# Patient Record
Sex: Female | Born: 1955 | Race: White | Hispanic: No | Marital: Married | Smoking: Never smoker
Health system: Southern US, Community
[De-identification: ages and names within clinical notes are randomized; demographics above are authoritative.]

## PROBLEM LIST (undated history)

## (undated) DIAGNOSIS — M1990 Unspecified osteoarthritis, unspecified site: Secondary | ICD-10-CM

## (undated) DIAGNOSIS — K219 Gastro-esophageal reflux disease without esophagitis: Secondary | ICD-10-CM

## (undated) HISTORY — PX: OVARIAN CYST SURGERY: SHX726

## (undated) HISTORY — PX: BREAST EXCISIONAL BIOPSY: SUR124

---

## 2003-07-18 ENCOUNTER — Inpatient Hospital Stay (HOSPITAL_COMMUNITY): Admission: EM | Admit: 2003-07-18 | Discharge: 2003-07-19 | Payer: Self-pay | Admitting: Emergency Medicine

## 2006-12-05 ENCOUNTER — Ambulatory Visit: Payer: Self-pay | Admitting: Internal Medicine

## 2013-06-04 ENCOUNTER — Observation Stay: Payer: Self-pay | Admitting: Family Medicine

## 2013-06-04 LAB — BASIC METABOLIC PANEL
Anion Gap: 3 — ABNORMAL LOW (ref 7–16)
BUN: 16 mg/dL (ref 7–18)
Creatinine: 0.94 mg/dL (ref 0.60–1.30)
Osmolality: 276 (ref 275–301)
Potassium: 4.1 mmol/L (ref 3.5–5.1)

## 2013-06-04 LAB — CBC
HCT: 37 % (ref 35.0–47.0)
HGB: 12.4 g/dL (ref 12.0–16.0)
MCH: 29.5 pg (ref 26.0–34.0)
MCV: 88 fL (ref 80–100)
Platelet: 229 10*3/uL (ref 150–440)
RBC: 4.19 10*6/uL (ref 3.80–5.20)
WBC: 7.1 10*3/uL (ref 3.6–11.0)

## 2013-06-04 LAB — CK TOTAL AND CKMB (NOT AT ARMC): CK-MB: 0.9 ng/mL (ref 0.5–3.6)

## 2013-06-04 LAB — TROPONIN I: Troponin-I: <0.02 ng/mL

## 2013-06-05 LAB — TROPONIN I
Troponin-I: <0.02 ng/mL
Troponin-I: <0.02 ng/mL

## 2013-06-05 LAB — CK TOTAL AND CKMB (NOT AT ARMC)
CK, Total: 78 U/L (ref 21–215)
CK-MB: 1.1 ng/mL (ref 0.5–3.6)

## 2013-07-16 ENCOUNTER — Emergency Department: Payer: Self-pay | Admitting: Emergency Medicine

## 2013-10-01 ENCOUNTER — Other Ambulatory Visit: Payer: Self-pay | Admitting: Orthopedic Surgery

## 2013-10-01 DIAGNOSIS — M25562 Pain in left knee: Secondary | ICD-10-CM

## 2013-10-06 ENCOUNTER — Ambulatory Visit
Admission: RE | Admit: 2013-10-06 | Discharge: 2013-10-06 | Disposition: A | Discharge status: Home / Self Care | Payer: No Typology Code available for payment source | Source: Ambulatory Visit | Attending: Orthopedic Surgery | Admitting: Orthopedic Surgery

## 2013-10-06 DIAGNOSIS — M25562 Pain in left knee: Secondary | ICD-10-CM

## 2013-12-26 ENCOUNTER — Emergency Department: Payer: Self-pay | Admitting: Emergency Medicine

## 2015-04-09 NOTE — Discharge Summary (Signed)
PATIENT NAME:  Danielle [name], Danielle [name] MR#:  [id number] DATE OF BIRTH:  [birth year]  DATE OF ADMISSION:  06/04/2013 DATE OF DISCHARGE:  06/05/2013  DISPOSITION: Home.   DISCHARGE FOLLOWUP:  Primary care physician, Dr. Loma Senderharles Phillips.  Follow up in 2 to 4 weeks.  ADMISSION DIAGNOSIS:  Atypical chest pain, likely musculoskeletal secondary to mild cough due to seasonal allergies.   DISCHARGE MEDICATIONS:  1.  Ibuprofen 200 mg take up to 4 tablets 2 x [name] day p.r.n.  2.  Pristiq 50 mg once [name] day.   IMPORTANT RESULTS: The patient's electrolytes were within normal limits with [name] sodium of 138 and potassium of 4.1. Troponins were negative x 3. White count 7.1, hemoglobin 12.  Lexi scan did not show any significant pathology.  Mostly [name] stress test was negative with no significant wall motion abnormalities and estimated ejection fraction of 62%.   Chest x-ray did not show any significant pathology either.   HOSPITAL COURSE:  The patient is [name] 59 year old female with history of depression, osteoarthritis, hyperlipidemia and acid reflux who comes with chest tightness and headache. Apparently, the patient had [name] headache earlier at home and slept until noon prior to coming to the hospital, woke up and went to work, and at work she got really hot since the air conditioner of her car was not working. She has been having [name] little bit of allergies, has been coughing [name] little bit more. No significant sputum. No significant fevers. No signs of pneumonia, just cough. She had an episode of vomiting and felt dizzy due to the extreme heat in the car and went to the ER. In the ER, she was feeling tightness of her chest. Her blood pressure was 155/67, and she was evaluated for chest pain. Her cardiac enzymes were negative and, as mentioned above, her Myoview exam was normal. Her pain was 100% reproducible and the patient is able to be discharged in good condition with follow-up with her primary care physician.   TIME SPENT:  About 35 minutes with this discharge on the day of discharge.  ____________________________ Felipa Furnaceoberto Sanchez Gutierrez, MD rsg:sb D: 06/06/2013 07:26:20 ET T: 06/06/2013 07:39:14 ET JOB#: [id number]  cc: Felipa Furnaceoberto Sanchez Gutierrez, MD, <Dictator> Marcine Matarharles W. Phillips Jr., MD Regan RakersOBERTO Juanda ChanceSANCHEZ GUTIERRE MD ELECTRONICALLY SIGNED 06/16/2013 6:41

## 2015-04-09 NOTE — H&P (Signed)
PATIENT NAME:  Danielle [name], Danielle [name] MR#:  [id number] DATE OF BIRTH:  [birth year]  DATE OF ADMISSION:  06/04/2013  PRIMARY CARE PHYSICIAN: Danielle Matarharles W. Phillips Jr., MD   CHIEF COMPLAINT: Chest tightness and headache.   HISTORY OF PRESENT ILLNESS:  Danielle [name] is [name] 59 year old Caucasian female with past medical history of osteoarthritis, acid reflux and depression, comes to the Emergency Room with complaints of chest tightness today. The patient says she had [name] headache earlier at home, slept until noon, woke up, went to work. She said she felt very hot since her air conditioning in the car was not working. After she went to work, her headache got aggravated. She had one-time vomiting and felt dizzy and had another episode of chest tightness. She was brought to the Emergency Room. Currently, she is hemodynamically stable other than her blood pressure 155/67. The patient's first set of cardiac enzymes are negative, and her EKG shows no acute changes. The patient is being admitted for further evaluation and management.   PAST MEDICAL HISTORY: 1.  Depression.  2.  Osteoarthritis.  3.  Acid reflux.   ALLERGIES: TO COMPAZINE.   MEDICATIONS: 1.  Ibuprofen 200 mg 4 tablets twice [name] day.  2.  Pristiq 50 mg extended release p.o. daily.   SOCIAL HISTORY: Nonsmoker. Nonalcoholic.  Works at [employer].   FAMILY HISTORY: Mother and father had heart disease.  They both died at age 60.   REVIEW OF SYSTEMS:   CONSTITUTIONAL: No fever, fatigue. Positive for weakness.  EYES: No blurred or double vision. No glaucoma or cataracts.  ENT: No tinnitus, ear pain, hearing loss.  RESPIRATORY: No cough, wheeze, hemoptysis or COPD.  CARDIOVASCULAR: Positive for chest tightness. No arrhythmia or dyspnea on exertion, palpitations.  GASTROINTESTINAL: No nausea. Positive for vomiting x 1. No diarrhea or abdominal pain.  GENITOURINARY: No dysuria, hematuria or kidney stones.  ENDOCRINE: No polyuria, nocturia or thyroid  problems.  HEMATOLOGY: No anemia or easy bruising.  SKIN: No acne or rash.  MUSCULOSKELETAL: Positive for arthritis left knee, chronic.  NEUROLOGIC: No CVA, TIA, vertigo, ataxia or dementia.  PSYCHIATRIC: No anxiety. Positive for depression.  All other systems reviewed and negative.   PHYSICAL EXAMINATION: GENERAL: The patient is awake, alert, oriented x 3, not in acute distress.  VITAL SIGNS: Afebrile, pulse is 66, blood pressure is 154/65, sats are 98% on room air.  HEENT: Atraumatic, normocephalic. PERRLA. EOM intact. Oral mucosa is moist.  NECK: Supple. No JVD. No carotid bruit.  RESPIRATORY: Clear to auscultation bilaterally. No rales, rhonchi, respiratory distress or labored breathing.  CARDIOVASCULAR: Both the heart sounds are normal. Rate, rhythm regular. PMI not lateralized. Chest is nontender.  EXTREMITIES: Good pedal pulses, good femoral pulses. No lower extremity edema.  ABDOMEN: Soft, benign, nontender. No organomegaly. Positive bowel sounds.  NEUROLOGIC: Grossly intact cranial nerves II through XII. No motor or sensory deficits.  PSYCHIATRIC: The patient is awake, alert, oriented x 3.  SKIN: Warm and dry.   LABORATORY AND RADIOLOGICAL DATA:  EKG: Sinus bradycardia, heart rate 59, no ST-T changes. CBC within normal limits. Basic metabolic panel within normal limits. Cardiac enzymes, first set negative.   ASSESSMENT: Danielle [name] is [name] 59 year old with history of osteoarthritis and depression, comes in with:   1.  Chest pain, rule out myocardial infarction/unstable angina:  We will admit patient for overnight observation on telemetry floor, cycle cardiac enzymes x 3. Regular diet and p.r.n. nitroglycerin and baby aspirin daily. We will start the patient  on some metoprolol since her blood pressure is [name] little bit on the higher side.  If it remains on the higher side, continue at discharge as well. Myoview stress test in the morning unless,if  patient rules in, consider  cardiology consultation.  2.  Gastroesophageal reflux disease:  Likely secondary to mild gastritis. The patient takes regularly ibuprofen. She is recommended to down on it and to consider more of Tylenol use. We will put the patient on Zantac.  3.  Depression:  Continue Pristiq.  4.  Chronic left knee arthritis:  I  recommended the patient to follow up, have outpatient follow up with Orthopedics.  5.  Deep vein thrombosis prophylaxis:  Heparin subcutaneous t.i.d.  6.  Further workup  according to the patient's clinical course.   Hospital admission plan was discussed with the patient.   TIME SPENT: 50 minutes.   ____________________________ Wylie Hail Allena Katz, MD sap:cb D: 06/04/2013 19:34:56 ET T: 06/04/2013 20:05:59 ET JOB#: [id number]  cc: Danielle Hendry [name]. Allena Katz, MD, <Dictator> Danielle Matar., MD  Danielle Ora MD ELECTRONICALLY SIGNED 06/11/2013 14:28

## 2015-10-19 ENCOUNTER — Other Ambulatory Visit: Payer: Self-pay | Admitting: Adult Health

## 2015-10-19 DIAGNOSIS — Z1231 Encounter for screening mammogram for malignant neoplasm of breast: Secondary | ICD-10-CM

## 2015-10-28 ENCOUNTER — Ambulatory Visit: Payer: No Typology Code available for payment source

## 2015-11-04 ENCOUNTER — Ambulatory Visit
Admission: RE | Admit: 2015-11-04 | Discharge: 2015-11-04 | Disposition: A | Discharge status: Home / Self Care | Payer: Commercial Managed Care - PPO | Source: Ambulatory Visit | Attending: Family Medicine | Admitting: Family Medicine

## 2015-11-04 DIAGNOSIS — Z1231 Encounter for screening mammogram for malignant neoplasm of breast: Secondary | ICD-10-CM | POA: Diagnosis not present

## 2016-04-02 ENCOUNTER — Emergency Department: Payer: Commercial Managed Care - PPO

## 2016-04-02 ENCOUNTER — Encounter: Payer: Self-pay | Admitting: Internal Medicine

## 2016-04-02 ENCOUNTER — Inpatient Hospital Stay
Admission: EM | Admit: 2016-04-02 | Discharge: 2016-04-10 | DRG: 872 | Disposition: A | Discharge status: Skilled Nursing Facility | Payer: Commercial Managed Care - PPO | Attending: Internal Medicine | Admitting: Internal Medicine

## 2016-04-02 DIAGNOSIS — Z888 Allergy status to other drugs, medicaments and biological substances status: Secondary | ICD-10-CM

## 2016-04-02 DIAGNOSIS — M25512 Pain in left shoulder: Secondary | ICD-10-CM | POA: Diagnosis present

## 2016-04-02 DIAGNOSIS — L03113 Cellulitis of right upper limb: Secondary | ICD-10-CM | POA: Diagnosis present

## 2016-04-02 DIAGNOSIS — R809 Proteinuria, unspecified: Secondary | ICD-10-CM | POA: Diagnosis present

## 2016-04-02 DIAGNOSIS — M1990 Unspecified osteoarthritis, unspecified site: Secondary | ICD-10-CM | POA: Diagnosis present

## 2016-04-02 DIAGNOSIS — A4101 Sepsis due to Methicillin susceptible Staphylococcus aureus: Secondary | ICD-10-CM | POA: Diagnosis not present

## 2016-04-02 DIAGNOSIS — K219 Gastro-esophageal reflux disease without esophagitis: Secondary | ICD-10-CM | POA: Diagnosis present

## 2016-04-02 DIAGNOSIS — R319 Hematuria, unspecified: Secondary | ICD-10-CM | POA: Diagnosis present

## 2016-04-02 DIAGNOSIS — E785 Hyperlipidemia, unspecified: Secondary | ICD-10-CM | POA: Diagnosis present

## 2016-04-02 DIAGNOSIS — M545 Low back pain, unspecified: Secondary | ICD-10-CM

## 2016-04-02 DIAGNOSIS — R4182 Altered mental status, unspecified: Secondary | ICD-10-CM | POA: Diagnosis not present

## 2016-04-02 DIAGNOSIS — M009 Pyogenic arthritis, unspecified: Secondary | ICD-10-CM | POA: Diagnosis present

## 2016-04-02 DIAGNOSIS — R7881 Bacteremia: Secondary | ICD-10-CM

## 2016-04-02 DIAGNOSIS — M549 Dorsalgia, unspecified: Secondary | ICD-10-CM | POA: Diagnosis present

## 2016-04-02 DIAGNOSIS — E860 Dehydration: Secondary | ICD-10-CM | POA: Diagnosis present

## 2016-04-02 DIAGNOSIS — G8929 Other chronic pain: Secondary | ICD-10-CM | POA: Diagnosis present

## 2016-04-02 DIAGNOSIS — R062 Wheezing: Secondary | ICD-10-CM | POA: Diagnosis present

## 2016-04-02 DIAGNOSIS — N390 Urinary tract infection, site not specified: Secondary | ICD-10-CM

## 2016-04-02 DIAGNOSIS — N179 Acute kidney failure, unspecified: Secondary | ICD-10-CM | POA: Diagnosis present

## 2016-04-02 DIAGNOSIS — M8448XA Pathological fracture, other site, initial encounter for fracture: Secondary | ICD-10-CM | POA: Diagnosis present

## 2016-04-02 DIAGNOSIS — M4316 Spondylolisthesis, lumbar region: Secondary | ICD-10-CM | POA: Diagnosis present

## 2016-04-02 HISTORY — DX: Unspecified osteoarthritis, unspecified site: M19.90

## 2016-04-02 HISTORY — DX: Gastro-esophageal reflux disease without esophagitis: K21.9

## 2016-04-02 LAB — URINE DRUG SCREEN, QUALITATIVE (ARMC ONLY)
AMPHETAMINES, UR SCREEN: NOT DETECTED
BENZODIAZEPINE, UR SCRN: NOT DETECTED
Barbiturates, Ur Screen: NOT DETECTED
CANNABINOID 50 NG, UR ~~LOC~~: NOT DETECTED
Cocaine Metabolite,Ur ~~LOC~~: NOT DETECTED
MDMA (Ecstasy)Ur Screen: NOT DETECTED
Methadone Scn, Ur: NOT DETECTED
Opiate, Ur Screen: POSITIVE — AB
Phencyclidine (PCP) Ur S: NOT DETECTED
TRICYCLIC, UR SCREEN: NOT DETECTED

## 2016-04-02 LAB — URINALYSIS COMPLETE WITH MICROSCOPIC (ARMC ONLY)
Bilirubin Urine: NEGATIVE
Glucose, UA: 50 mg/dL — AB
Ketones, ur: NEGATIVE mg/dL
Nitrite: NEGATIVE
PH: 5 (ref 5.0–8.0)
PROTEIN: 100 mg/dL — AB
SPECIFIC GRAVITY, URINE: 1.016 (ref 1.005–1.030)

## 2016-04-02 LAB — CBC WITH DIFFERENTIAL/PLATELET
Basophils Absolute: 0 10*3/uL (ref 0–0.1)
Basophils Relative: 0 %
EOS PCT: 0 %
Eosinophils Absolute: 0 10*3/uL (ref 0–0.7)
HCT: 32 % — ABNORMAL LOW (ref 35.0–47.0)
Hemoglobin: 10.5 g/dL — ABNORMAL LOW (ref 12.0–16.0)
LYMPHS ABS: 0.5 10*3/uL — AB (ref 1.0–3.6)
LYMPHS PCT: 2 %
MCH: 28.8 pg (ref 26.0–34.0)
MCHC: 33 g/dL (ref 32.0–36.0)
MCV: 87.2 fL (ref 80.0–100.0)
MONO ABS: 1.2 10*3/uL — AB (ref 0.2–0.9)
MONOS PCT: 4 %
Neutro Abs: 25.8 10*3/uL — ABNORMAL HIGH (ref 1.4–6.5)
Neutrophils Relative %: 94 %
PLATELETS: 234 10*3/uL (ref 150–440)
RBC: 3.67 MIL/uL — ABNORMAL LOW (ref 3.80–5.20)
RDW: 15.3 % — AB (ref 11.5–14.5)
WBC: 27.6 10*3/uL — ABNORMAL HIGH (ref 3.6–11.0)

## 2016-04-02 LAB — COMPREHENSIVE METABOLIC PANEL
ALBUMIN: 3.2 g/dL — AB (ref 3.5–5.0)
ALT: 40 U/L (ref 14–54)
AST: 27 U/L (ref 15–41)
Alkaline Phosphatase: 164 U/L — ABNORMAL HIGH (ref 38–126)
Anion gap: 14 (ref 5–15)
BILIRUBIN TOTAL: 0.2 mg/dL — AB (ref 0.3–1.2)
BUN: 69 mg/dL — AB (ref 6–20)
CHLORIDE: 102 mmol/L (ref 101–111)
CO2: 18 mmol/L — AB (ref 22–32)
Calcium: 8.2 mg/dL — ABNORMAL LOW (ref 8.9–10.3)
Creatinine, Ser: 3.66 mg/dL — ABNORMAL HIGH (ref 0.44–1.00)
GFR calc Af Amer: 14 mL/min — ABNORMAL LOW (ref >60)
GFR calc non Af Amer: 12 mL/min — ABNORMAL LOW (ref >60)
GLUCOSE: 120 mg/dL — AB (ref 65–99)
Potassium: 3.9 mmol/L (ref 3.5–5.1)
Sodium: 134 mmol/L — ABNORMAL LOW (ref 135–145)
Total Protein: 7.2 g/dL (ref 6.5–8.1)

## 2016-04-02 LAB — LACTIC ACID, PLASMA: Lactic Acid, Venous: 1.2 mmol/L (ref 0.5–2.0)

## 2016-04-02 LAB — BRAIN NATRIURETIC PEPTIDE: B NATRIURETIC PEPTIDE 5: 506 pg/mL — AB (ref 0.0–100.0)

## 2016-04-02 LAB — TROPONIN I: TROPONIN I: 0.06 ng/mL — AB (ref <0.031)

## 2016-04-02 MED ORDER — SODIUM CHLORIDE 0.9 % IV BOLUS (SEPSIS)
1000.0000 mL | Freq: Once | INTRAVENOUS | Status: AC
  Administered 2016-04-02: 1000 mL via INTRAVENOUS

## 2016-04-02 MED ORDER — PIPERACILLIN-TAZOBACTAM 3.375 G IVPB 30 MIN
3.3750 g | Freq: Once | INTRAVENOUS | Status: AC
  Administered 2016-04-02: 3.375 g via INTRAVENOUS
  Filled 2016-04-02: qty 50

## 2016-04-02 MED ORDER — SODIUM CHLORIDE 0.9 % IV BOLUS (SEPSIS): 500.0000 mL | Freq: Once | INTRAVENOUS | Status: DC

## 2016-04-02 NOTE — H&P (Signed)
[facility] [place] [facility]   PATIENT NAME: Danielle GaulRebecca [name]    MR#:  [id number]  DATE OF BIRTH:  1956-10-30  DATE OF ADMISSION:  04/02/2016  PRIMARY CARE PHYSICIAN: No primary care provider on file.   REQUESTING/REFERRING PHYSICIAN:   CHIEF COMPLAINT:   Chief Complaint  Patient presents with  . Shortness of Breath  . Altered Mental Status    HISTORY OF PRESENT ILLNESS: Danielle GaulRebecca [name]  is a 60 y.o. female with a known history of Osteoarthritis, GERD presented to the emergency room because of weakness and somnolence. Patient had been recently seeing the orthopedic doctor and also [facility] for low back pain and she is on pain medication and also completed her dose of oral steroids. Her last day of oral steroids for this today. According to the family members patient has been more lethargic and somnolent after being on pain medication. Patient is awake and alert and responds to all verbal commands in the emergency room today. Has history of dysuria. No history of any chest pain or shortness of breath. Has generalized weakness. Upon evaluation the emergency room she appeared to be dehydrated and also her creatinine was elevated. Patient was started on IV fluids and the received a IV antibiotic in the emergency room for possible urinary tract infection.No complaints of cough. No history or recent travel or sick contacts at home.  PAST MEDICAL HISTORY:   Past Medical History  Diagnosis Date  . GERD (gastroesophageal reflux disease)   . Osteoarthritis     PAST SURGICAL HISTORY: Past Surgical History  Procedure Laterality Date  . Breast excisional biopsy Left     1981 cyst removed  . Ovarian cyst surgery      SOCIAL HISTORY:  Social History  Substance Use Topics  . Smoking status: Never Smoker   . Smokeless tobacco: Not on file  . Alcohol Use: No    FAMILY HISTORY:  Family History  Problem Relation Age of Onset  . Breast cancer Neg Hx      DRUG ALLERGIES:  Allergies  Allergen Reactions  . Compazine [Prochlorperazine Edisylate] Nausea And Vomiting    REVIEW OF SYSTEMS:   CONSTITUTIONAL: No fever, has weakness.  EYES: No blurred or double vision.  EARS, NOSE, AND THROAT: No tinnitus or ear pain.  RESPIRATORY: No cough, shortness of breath, wheezing or hemoptysis.  CARDIOVASCULAR: No chest pain, orthopnea, edema.  GASTROINTESTINAL: No nausea, vomiting, diarrhea or abdominal pain.  GENITOURINARY: Has dysuria, hematuria.  ENDOCRINE: No polyuria, nocturia,  HEMATOLOGY: No anemia, easy bruising or bleeding SKIN: No rash or lesion. MUSCULOSKELETAL: No joint pain or arthritis.  Low back pain NEUROLOGIC: No tingling, numbness, weakness.  PSYCHIATRY: No anxiety or depression.   MEDICATIONS AT HOME:  Prior to Admission medications   Medication Sig Start Date End Date Taking? Authorizing Provider  albuterol (PROVENTIL HFA;VENTOLIN HFA) 108 (90 Base) MCG/ACT inhaler Inhale 2 puffs into the lungs every 6 (six) hours as needed for wheezing or shortness of breath.   Yes Historical Provider, MD  ALPRAZolam Prudy Feeler(XANAX) 0.5 MG tablet Take 0.5 mg by mouth at bedtime as needed for anxiety.   Yes Historical Provider, MD  desvenlafaxine (PRISTIQ) 50 MG 24 hr tablet Take 50 mg by mouth daily.   Yes Historical Provider, MD  meloxicam (MOBIC) 15 MG tablet Take 15 mg by mouth daily.   Yes Historical Provider, MD  pantoprazole (PROTONIX) 40 MG tablet Take 40 mg by mouth daily.   Yes Historical  Provider, MD  tiZANidine (ZANAFLEX) 4 MG tablet Take 4 mg by mouth at bedtime.   Yes Historical Provider, MD  traMADol (ULTRAM) 50 MG tablet Take 50 mg by mouth every 6 (six) hours as needed.   Yes Historical Provider, MD      PHYSICAL EXAMINATION:   VITAL SIGNS: Blood pressure 127/74, pulse 99, temperature 98 F (36.7 C), resp. rate 22, height  (1.549 m), weight 87.544 kg (193 lb), SpO2 94 %.  GENERAL:  60 y.o.-year-old patient lying in the  bed with no acute distress.  EYES: Pupils equal, round, reactive to light and accommodation. No scleral icterus. Extraocular muscles intact.  HEENT: Head atraumatic, normocephalic. Oropharynx dry and nasopharynx clear.  NECK:  Supple, no jugular venous distention. No thyroid enlargement, no tenderness.  LUNGS: Normal breath sounds bilaterally, no wheezing, rales,rhonchi or crepitation. No use of accessory muscles of respiration.  CARDIOVASCULAR: S1, S2 normal. No murmurs, rubs, or gallops.  ABDOMEN: Soft, nontender, nondistended. Bowel sounds present. No organomegaly or mass.  EXTREMITIES: No pedal edema, cyanosis, or clubbing.  NEUROLOGIC: Cranial nerves II through XII are intact. Muscle strength 5/5 in all extremities. Sensation intact. Gait not checked.  PSYCHIATRIC: The patient is alert and oriented x 3.  SKIN: No obvious rash, lesion, or ulcer.   LABORATORY PANEL:   CBC  Recent Labs Lab 04/02/16 1916  WBC 27.6*  HGB 10.5*  HCT 32.0*  PLT 234  MCV 87.2  MCH 28.8  MCHC 33.0  RDW 15.3*  LYMPHSABS 0.5*  MONOABS 1.2*  EOSABS 0.0  BASOSABS 0.0   ------------------------------------------------------------------------------------------------------------------  Chemistries   Recent Labs Lab 04/02/16 1916  NA 134*  K 3.9  CL 102  CO2 18*  GLUCOSE 120*  BUN 69*  CREATININE 3.66*  CALCIUM 8.2*  AST 27  ALT 40  ALKPHOS 164*  BILITOT 0.2*   ------------------------------------------------------------------------------------------------------------------ estimated creatinine clearance is 16.2 mL/min (by C-G formula based on Cr of 3.66). ------------------------------------------------------------------------------------------------------------------ No results for input(s): TSH, T4TOTAL, T3FREE, THYROIDAB in the last 72 hours.  Invalid input(s): FREET3   Coagulation profile No results for input(s): INR, PROTIME in the last 168  hours. ------------------------------------------------------------------------------------------------------------------- No results for input(s): DDIMER in the last 72 hours. -------------------------------------------------------------------------------------------------------------------  Cardiac Enzymes  Recent Labs Lab 04/02/16 1916  TROPONINI 0.06*   ------------------------------------------------------------------------------------------------------------------ Invalid input(s): POCBNP  ---------------------------------------------------------------------------------------------------------------  Urinalysis    Component Value Date/Time   COLORURINE AMBER* 04/02/2016 1916   APPEARANCEUR CLOUDY* 04/02/2016 1916   LABSPEC 1.016 04/02/2016 1916   PHURINE 5.0 04/02/2016 1916   GLUCOSEU 50* 04/02/2016 1916   HGBUR 2+* 04/02/2016 1916   BILIRUBINUR NEGATIVE 04/02/2016 1916   KETONESUR NEGATIVE 04/02/2016 1916   PROTEINUR 100* 04/02/2016 1916   NITRITE NEGATIVE 04/02/2016 1916   LEUKOCYTESUR 1+* 04/02/2016 1916     RADIOLOGY: Dg Chest 2 View  04/02/2016  CLINICAL DATA:  Altered mental status.  Wheezing. EXAM: CHEST  2 VIEW COMPARISON:  06/04/2013 FINDINGS: Slight increase of left hemidiaphragm with linear atelectasis in the left lung base. No focal consolidation or airspace disease. No blunting of costophrenic angles. No pneumothorax. Normal heart size and pulmonary vascularity. Degenerative changes in the spine and shoulders. IMPRESSION: Elevation of left hemidiaphragm with slight linear atelectasis in the left lung base. Electronically Signed   By: Burman Nieves M.D.   On: 04/02/2016 21:20   Dg Hand Complete Right  04/02/2016  CLINICAL DATA:  Patient with metacarpal pain after hitting hand on the porch railing. Initial encounter. EXAM: RIGHT HAND -  COMPLETE 3+ VIEW COMPARISON:  Hand radiograph 07/16/2013 FINDINGS: Normal anatomic alignment. No evidence for acute fracture  or dislocation. Soft tissue swelling about the dorsum of the hand. IMPRESSION: No acute osseous abnormality. Electronically Signed   By: Annia Belt M.D.   On: 04/02/2016 21:22    EKG: Orders placed or performed during the hospital encounter of 04/02/16  . EKG 12-Lead  . EKG 12-Lead    IMPRESSION AND PLAN: 60 year old female patient with history of osteoarthritis, GERD, back pain presented to the emergency room with generalized weakness and transient disorientation. Admitting diagnosis 1. Dehydration 2. Acute kidney injury 3. Urinary tract infection 4. Leukocytosis which could be secondary to steroids 5. Osteoarthritis 6. GERD Treatment plan Admit patient to medical floor observation bed IV fluid hydration Start patient on IV Rocephin antibiotic and follow-up urine culture Follow-up renal functions and electrolytes DVT prophylaxis with subcutaneous heparin Monitor WBC count. Supportive care  All the records are reviewed and case discussed with ED provider. Management plans discussed with the patient, family and they are in agreement.  CODE STATUS:FULL  Code Status History    This patient does not have a recorded code status. Please follow your organizational policy for patients in this situation.       TOTAL TIME TAKING CARE OF THIS PATIENT: 50 minutes.    Ihor Austin M.D on 04/02/2016 at 11:41 PM  Between 7am to 6pm - Pager - [phone]  After 6pm go to [email or web address] - password [email or web address] [facility]  [place] [facility]  Office  [phone]  CC: Primary care physician; No primary care provider on file.

## 2016-04-02 NOTE — ED Provider Notes (Signed)
[facility] Emergency Department Provider Note  ____________________________________________   I have reviewed the triage vital signs and the nursing notes.   HISTORY  Chief Complaint Shortness of Breath and Altered Mental Status    HPI Danielle [name] is a 60 y.o. female who has a history of chronic back pain, was acutely worsen her back pain a partially one month ago. Has had multiple visits to emergency department and orthopedic surgeon and primary care for this. She has been neurologically intact with no incontinence of bowel or bladder. The patient did have an MRI which showed sacral insufficiency fractures. She was started on steroids at one point. She says she took her last steroid dose yesterday. In addition, the patient had a urinary tract infection several weeks ago which she has taken all the antibiotics for. She went to [facility] on the 12th and was found to be febrile at 38.8 with a white count that was elevated but as no source of infection was found she was sent home. Patient states that she was also started at that time on high-dose morphine. Patient has never already that kind of medication well. She states that since taking the morphine she's been somnolent, part of this history is per family. Patient also has had a cough that is productive over the last few days and has been At times difficult to arouse after taking the medication. Her back pain however persists. She denies any focal numbness or weakness. She does also have occasional dysuria.The patient has the family very concerned because of her generalized deterioration     No past medical history on file.  There are no active problems to display for this patient.   Past Surgical History  Procedure Laterality Date  . Breast excisional biopsy Left     1981 cyst removed    Current Outpatient Rx  Name  Route  Sig  Dispense  Refill  . albuterol (PROVENTIL HFA;VENTOLIN HFA) 108 (90 Base) MCG/ACT  inhaler   Inhalation   Inhale 2 puffs into the lungs every 6 (six) hours as needed for wheezing or shortness of breath.         . ALPRAZolam (XANAX) 0.5 MG tablet   Oral   Take 0.5 mg by mouth at bedtime as needed for anxiety.         Marland Kitchen desvenlafaxine (PRISTIQ) 50 MG 24 hr tablet   Oral   Take 50 mg by mouth daily.         . meloxicam (MOBIC) 15 MG tablet   Oral   Take 15 mg by mouth daily.         . pantoprazole (PROTONIX) 40 MG tablet   Oral   Take 40 mg by mouth daily.         Marland Kitchen tiZANidine (ZANAFLEX) 4 MG tablet   Oral   Take 4 mg by mouth at bedtime.         . traMADol (ULTRAM) 50 MG tablet   Oral   Take 50 mg by mouth every 6 (six) hours as needed.           Allergies Compazine  Family History  Problem Relation Age of Onset  . Breast cancer Neg Hx     Social History Social History  Substance Use Topics  . Smoking status: Not on file  . Smokeless tobacco: Not on file  . Alcohol Use: Not on file    Review of Systems Constitutional:See history of present illness Eyes: No  visual changes. ENT: No sore throat. No stiff neck no neck pain Cardiovascular: Denies chest pain. Respiratory: Positive shortness of breath. Gastrointestinal:   no vomiting.  No diarrhea.  No constipation. Genitourinary: Positive for dysuria. Musculoskeletal: Negative lower extremity swelling Skin: Negative for rash. Neurological: Negative for headaches, focal weakness or numbness. 10-point ROS otherwise negative.  ____________________________________________   PHYSICAL EXAM:  VITAL SIGNS: ED Triage Vitals  Enc Vitals Group     BP 04/02/16 1914 117/51 mmHg     Pulse Rate 04/02/16 1914 93     Resp 04/02/16 1914 18     Temp 04/02/16 1914 98 F (36.7 C)     Temp src --      SpO2 04/02/16 1914 93 %     Weight 04/02/16 1914 193 lb (87.544 kg)     Height 04/02/16 1914  (1.549 m)     Head Cir --      Peak Flow --      Pain Score 04/02/16 1914 10     Pain  Loc --      Pain Edu? --      Excl. in GC? --     Constitutional: Somnolent originally and then thereafter awake and oriented. In no acute medical distress Eyes: Conjunctivae are normal. PERRL. EOMI. Head: Atraumatic. Nose: No congestion/rhinnorhea. Mouth/Throat: Mucous membranes are moist.  Oropharynx non-erythematous. Neck: No stridor.   Nontender with no meningismus Cardiovascular: Normal rate, regular rhythm. Grossly normal heart sounds.  Good peripheral circulation. Respiratory: Normal respiratory effort.  No retractions. Mild wheeze noted initially then clear after that. Abdominal: Soft and nontender. No distention. No guarding no rebound Back:  Tender to palpation in low back with no step off there is no midline tenderness there are no lesions noted. there is no CVA tenderness there is no obvious lesions noted. Musculoskeletal: No lower extremity tenderness. No joint effusions, no DVT signs strong distal pulses no edema Neurologic:  Normal speech and language. No gross focal neurologic deficits are appreciated. No saddle anesthesia Skin:  Skin is warm, dry and intact. No rash noted. Psychiatric: Mood and affect are normal. Speech and behavior are normal.  ____________________________________________   LABS (all labs ordered are listed, but only abnormal results are displayed)  Labs Reviewed  COMPREHENSIVE METABOLIC PANEL - Abnormal; Notable for the following:    Sodium 134 (*)    CO2 18 (*)    Glucose, Bld 120 (*)    BUN 69 (*)    Creatinine, Ser 3.66 (*)    Calcium 8.2 (*)    Albumin 3.2 (*)    Alkaline Phosphatase 164 (*)    Total Bilirubin 0.2 (*)    GFR calc non Af Amer 12 (*)    GFR calc Af Amer 14 (*)    All other components within normal limits  CBC WITH DIFFERENTIAL/PLATELET - Abnormal; Notable for the following:    WBC 27.6 (*)    RBC 3.67 (*)    Hemoglobin 10.5 (*)    HCT 32.0 (*)    RDW 15.3 (*)    Neutro Abs 25.8 (*)    Lymphs Abs 0.5 (*)    Monocytes  Absolute 1.2 (*)    All other components within normal limits  URINALYSIS COMPLETEWITH MICROSCOPIC ([facility] ONLY) - Abnormal; Notable for the following:    Color, Urine AMBER (*)    APPearance CLOUDY (*)    Glucose, UA 50 (*)    Hgb urine dipstick 2+ (*)    Protein,  ur 100 (*)    Leukocytes, UA 1+ (*)    Bacteria, UA RARE (*)    Squamous Epithelial / LPF 6-30 (*)    All other components within normal limits  URINE DRUG SCREEN, QUALITATIVE ([facility] ONLY) - Abnormal; Notable for the following:    Opiate, Ur Screen POSITIVE (*)    All other components within normal limits  TROPONIN I - Abnormal; Notable for the following:    Troponin I 0.06 (*)    All other components within normal limits  BRAIN NATRIURETIC PEPTIDE - Abnormal; Notable for the following:    B Natriuretic Peptide 506.0 (*)    All other components within normal limits  CULTURE, BLOOD (ROUTINE X 2)  CULTURE, BLOOD (ROUTINE X 2)  URINE CULTURE  LACTIC ACID, PLASMA  LACTIC ACID, PLASMA   ____________________________________________  EKG  I personally interpreted any EKGs ordered by me or triage Sinus rhythm rate 92 no acute ST elevation or depression normal axis ____________________________________________  RADIOLOGY  I reviewed any imaging ordered by me or triage that were performed during my shift and, if possible, patient and/or family made aware of any abnormal findings. ____________________________________________   PROCEDURES  Procedure(s) performed: None  Critical Care performed: CRITICAL CARE Performed by: Jeanmarie PlantJAMES A MCSHANE   Total critical care time: 38 minutes  Critical care time was exclusive of separately billable procedures and treating other patients.  Critical care was necessary to treat or prevent imminent or life-threatening deterioration.  Critical care was time spent personally by me on the following activities: development of treatment plan with patient and/or surrogate as well as nursing,  discussions with consultants, evaluation of patient's response to treatment, examination of patient, obtaining history from patient or surrogate, ordering and performing treatments and interventions, ordering and review of laboratory studies, ordering and review of radiographic studies, pulse oximetry and re-evaluation of patient's condition.   ____________________________________________   INITIAL IMPRESSION / ASSESSMENT AND PLAN / ED COURSE  Pertinent labs & imaging results that were available during my care of the patient were reviewed by me and considered in my medical decision making (see chart for details).  Patient with multiple different issues, she has elevated white count but just finished steroids and has a urinary tract infection she is afebrile not septic in appearance. She has cough which is productive and I'm concerned about the possibility of aspiration although chest x-ray at this time is negative. She has acute renal injury as well. Significant dehydration. We are giving her IV hydration, broad-spectrum antibiotics, and we will admit to the hospital for further evaluation. We did discuss also with the intensivist at the request of the hospitalist, they do not feel that she requires ICU at this time and I agree. ____________________________________________   FINAL CLINICAL IMPRESSION(S) / ED DIAGNOSES  Final diagnoses:  None      This chart was dictated using voice recognition software.  Despite best efforts to proofread,  errors can occur which can change meaning.     Jeanmarie PlantJames A McShane, MD 04/02/16 2221

## 2016-04-02 NOTE — ED Notes (Signed)
Family at bedside.Pt in no acute distress at this time

## 2016-04-02 NOTE — ED Notes (Signed)
Per ems pts family repolrts altered mental status. Pt is a&O x 3. Ems administered 1 duoneb for wheezing. Pt in no distress at this time. Pt reports uti dx but n ot taking antibiotics as prescribed. Pt also reports back pain.

## 2016-04-03 ENCOUNTER — Encounter: Payer: Self-pay | Admitting: *Deleted

## 2016-04-03 ENCOUNTER — Inpatient Hospital Stay: Payer: Commercial Managed Care - PPO

## 2016-04-03 DIAGNOSIS — Z888 Allergy status to other drugs, medicaments and biological substances status: Secondary | ICD-10-CM | POA: Diagnosis not present

## 2016-04-03 DIAGNOSIS — R319 Hematuria, unspecified: Secondary | ICD-10-CM | POA: Diagnosis present

## 2016-04-03 DIAGNOSIS — M4316 Spondylolisthesis, lumbar region: Secondary | ICD-10-CM | POA: Diagnosis present

## 2016-04-03 DIAGNOSIS — R062 Wheezing: Secondary | ICD-10-CM | POA: Diagnosis present

## 2016-04-03 DIAGNOSIS — R509 Fever, unspecified: Secondary | ICD-10-CM | POA: Diagnosis not present

## 2016-04-03 DIAGNOSIS — R4182 Altered mental status, unspecified: Secondary | ICD-10-CM | POA: Diagnosis present

## 2016-04-03 DIAGNOSIS — N179 Acute kidney failure, unspecified: Secondary | ICD-10-CM | POA: Diagnosis present

## 2016-04-03 DIAGNOSIS — L03113 Cellulitis of right upper limb: Secondary | ICD-10-CM | POA: Diagnosis present

## 2016-04-03 DIAGNOSIS — M25512 Pain in left shoulder: Secondary | ICD-10-CM | POA: Diagnosis present

## 2016-04-03 DIAGNOSIS — M009 Pyogenic arthritis, unspecified: Secondary | ICD-10-CM | POA: Diagnosis present

## 2016-04-03 DIAGNOSIS — A4101 Sepsis due to Methicillin susceptible Staphylococcus aureus: Secondary | ICD-10-CM | POA: Diagnosis present

## 2016-04-03 DIAGNOSIS — K219 Gastro-esophageal reflux disease without esophagitis: Secondary | ICD-10-CM | POA: Diagnosis present

## 2016-04-03 DIAGNOSIS — E860 Dehydration: Secondary | ICD-10-CM | POA: Diagnosis present

## 2016-04-03 DIAGNOSIS — E785 Hyperlipidemia, unspecified: Secondary | ICD-10-CM | POA: Diagnosis present

## 2016-04-03 DIAGNOSIS — N390 Urinary tract infection, site not specified: Secondary | ICD-10-CM | POA: Diagnosis present

## 2016-04-03 DIAGNOSIS — R809 Proteinuria, unspecified: Secondary | ICD-10-CM | POA: Diagnosis present

## 2016-04-03 DIAGNOSIS — G8929 Other chronic pain: Secondary | ICD-10-CM | POA: Diagnosis present

## 2016-04-03 DIAGNOSIS — M1990 Unspecified osteoarthritis, unspecified site: Secondary | ICD-10-CM | POA: Diagnosis present

## 2016-04-03 DIAGNOSIS — M549 Dorsalgia, unspecified: Secondary | ICD-10-CM | POA: Diagnosis present

## 2016-04-03 DIAGNOSIS — M8448XA Pathological fracture, other site, initial encounter for fracture: Secondary | ICD-10-CM | POA: Diagnosis present

## 2016-04-03 LAB — CBC
HCT: 27.6 % — ABNORMAL LOW (ref 35.0–47.0)
Hemoglobin: 9.2 g/dL — ABNORMAL LOW (ref 12.0–16.0)
MCH: 29.4 pg (ref 26.0–34.0)
MCHC: 33.2 g/dL (ref 32.0–36.0)
MCV: 88.3 fL (ref 80.0–100.0)
PLATELETS: 214 10*3/uL (ref 150–440)
RBC: 3.12 MIL/uL — ABNORMAL LOW (ref 3.80–5.20)
RDW: 15.3 % — AB (ref 11.5–14.5)
WBC: 25 10*3/uL — ABNORMAL HIGH (ref 3.6–11.0)

## 2016-04-03 LAB — BLOOD CULTURE ID PANEL (REFLEXED)
Acinetobacter baumannii: NOT DETECTED
CANDIDA KRUSEI: NOT DETECTED
CANDIDA TROPICALIS: NOT DETECTED
CARBAPENEM RESISTANCE: NOT DETECTED
Candida albicans: NOT DETECTED
Candida glabrata: NOT DETECTED
Candida parapsilosis: NOT DETECTED
ENTEROCOCCUS SPECIES: NOT DETECTED
Enterobacter cloacae complex: NOT DETECTED
Enterobacteriaceae species: NOT DETECTED
Escherichia coli: NOT DETECTED
Haemophilus influenzae: NOT DETECTED
KLEBSIELLA OXYTOCA: NOT DETECTED
KLEBSIELLA PNEUMONIAE: NOT DETECTED
LISTERIA MONOCYTOGENES: NOT DETECTED
Methicillin resistance: NOT DETECTED
Neisseria meningitidis: NOT DETECTED
PROTEUS SPECIES: NOT DETECTED
PSEUDOMONAS AERUGINOSA: NOT DETECTED
SERRATIA MARCESCENS: NOT DETECTED
STAPHYLOCOCCUS SPECIES: DETECTED — AB
STREPTOCOCCUS AGALACTIAE: NOT DETECTED
Staphylococcus aureus (BCID): DETECTED — AB
Streptococcus pneumoniae: NOT DETECTED
Streptococcus pyogenes: NOT DETECTED
Streptococcus species: NOT DETECTED
Vancomycin resistance: NOT DETECTED

## 2016-04-03 LAB — BASIC METABOLIC PANEL
Anion gap: 12 (ref 5–15)
BUN: 70 mg/dL — AB (ref 6–20)
CALCIUM: 7.9 mg/dL — AB (ref 8.9–10.3)
CHLORIDE: 103 mmol/L (ref 101–111)
CO2: 20 mmol/L — AB (ref 22–32)
CREATININE: 3.89 mg/dL — AB (ref 0.44–1.00)
GFR calc Af Amer: 13 mL/min — ABNORMAL LOW (ref >60)
GFR calc non Af Amer: 12 mL/min — ABNORMAL LOW (ref >60)
Glucose, Bld: 109 mg/dL — ABNORMAL HIGH (ref 65–99)
Potassium: 3.3 mmol/L — ABNORMAL LOW (ref 3.5–5.1)
Sodium: 135 mmol/L (ref 135–145)

## 2016-04-03 LAB — LACTIC ACID, PLASMA: Lactic Acid, Venous: 1.1 mmol/L (ref 0.5–2.0)

## 2016-04-03 MED ORDER — POTASSIUM CHLORIDE 20 MEQ PO PACK
40.0000 meq | PACK | Freq: Once | ORAL | Status: AC
  Administered 2016-04-03: 40 meq via ORAL
  Filled 2016-04-03: qty 2

## 2016-04-03 MED ORDER — ACETAMINOPHEN 650 MG RE SUPP: 650.0000 mg | Freq: Four times a day (QID) | RECTAL | Status: DC | PRN

## 2016-04-03 MED ORDER — CEFAZOLIN SODIUM-DEXTROSE 2-4 GM/100ML-% IV SOLN
2.0000 g | Freq: Two times a day (BID) | INTRAVENOUS | Status: DC
  Administered 2016-04-03 – 2016-04-06 (×6): 2 g via INTRAVENOUS
  Filled 2016-04-03 (×8): qty 100

## 2016-04-03 MED ORDER — SODIUM CHLORIDE 0.9 % IV SOLN
INTRAVENOUS | Status: DC
  Administered 2016-04-03 – 2016-04-04 (×3): via INTRAVENOUS

## 2016-04-03 MED ORDER — SENNOSIDES-DOCUSATE SODIUM 8.6-50 MG PO TABS
1.0000 | ORAL_TABLET | Freq: Every evening | ORAL | Status: DC | PRN
  Filled 2016-04-03: qty 1

## 2016-04-03 MED ORDER — PANTOPRAZOLE SODIUM 40 MG PO TBEC
40.0000 mg | DELAYED_RELEASE_TABLET | Freq: Every day | ORAL | Status: DC
Start: 2016-04-03 — End: 2016-04-10
  Administered 2016-04-03 – 2016-04-10 (×8): 40 mg via ORAL
  Filled 2016-04-03 (×8): qty 1

## 2016-04-03 MED ORDER — DIPHENHYDRAMINE HCL 25 MG PO CAPS: 25.0000 mg | ORAL_CAPSULE | Freq: Three times a day (TID) | ORAL | Status: DC | PRN

## 2016-04-03 MED ORDER — VENLAFAXINE HCL ER 37.5 MG PO CP24
37.5000 mg | ORAL_CAPSULE | Freq: Every day | ORAL | Status: DC
  Administered 2016-04-03 – 2016-04-10 (×8): 37.5 mg via ORAL
  Filled 2016-04-03 (×9): qty 1

## 2016-04-03 MED ORDER — ONDANSETRON HCL 4 MG PO TABS: 4.0000 mg | ORAL_TABLET | Freq: Four times a day (QID) | ORAL | Status: DC | PRN

## 2016-04-03 MED ORDER — TIZANIDINE HCL 4 MG PO TABS
4.0000 mg | ORAL_TABLET | Freq: Every day | ORAL | Status: DC
  Administered 2016-04-03 – 2016-04-09 (×7): 4 mg via ORAL
  Filled 2016-04-03 (×7): qty 1

## 2016-04-03 MED ORDER — DEXTROSE 5 % IV SOLN
1.0000 g | Freq: Every day | INTRAVENOUS | Status: DC
  Administered 2016-04-03: 1 g via INTRAVENOUS
  Filled 2016-04-03 (×2): qty 10

## 2016-04-03 MED ORDER — ALPRAZOLAM 0.5 MG PO TABS: 0.5000 mg | ORAL_TABLET | Freq: Every evening | ORAL | Status: DC | PRN

## 2016-04-03 MED ORDER — HEPARIN SODIUM (PORCINE) 5000 UNIT/ML IJ SOLN
5000.0000 [IU] | Freq: Three times a day (TID) | INTRAMUSCULAR | Status: DC
  Administered 2016-04-03 – 2016-04-06 (×11): 5000 [IU] via SUBCUTANEOUS
  Filled 2016-04-03 (×12): qty 1

## 2016-04-03 MED ORDER — ALBUTEROL SULFATE (2.5 MG/3ML) 0.083% IN NEBU
3.0000 mL | INHALATION_SOLUTION | Freq: Four times a day (QID) | RESPIRATORY_TRACT | Status: DC | PRN
  Administered 2016-04-03 – 2016-04-06 (×5): 3 mL via RESPIRATORY_TRACT
  Filled 2016-04-03 (×7): qty 3

## 2016-04-03 MED ORDER — ONDANSETRON HCL 4 MG/2ML IJ SOLN
4.0000 mg | Freq: Four times a day (QID) | INTRAMUSCULAR | Status: DC | PRN
  Administered 2016-04-04: 4 mg via INTRAVENOUS
  Filled 2016-04-03: qty 2

## 2016-04-03 MED ORDER — ACETAMINOPHEN 325 MG PO TABS
650.0000 mg | ORAL_TABLET | Freq: Four times a day (QID) | ORAL | Status: DC | PRN
  Administered 2016-04-03 – 2016-04-09 (×8): 650 mg via ORAL
  Filled 2016-04-03 (×8): qty 2

## 2016-04-03 NOTE — Progress Notes (Signed)
Pharmacy Antibiotic Note  Danielle PatchesRebecca A [name] is a 60 y.o. female admitted on 04/02/2016 with UTI.  Pharmacy has been consulted for ceftriaxone dosing. Note, patient was treated with oral Augmentin last month for UTI, which showed mixed urinary flora.   Plan: Ceftriaxone 1 gm IV Q24H.   Height: 5\' 1"  (154.9 cm) Weight: 193 lb (87.544 kg) IBW/kg (Calculated) : 47.8  Temp (24hrs), Avg:98 F (36.7 C), Min:98 F (36.7 C), Max:98 F (36.7 C)   Recent Labs Lab 04/02/16 1916  WBC 27.6*  CREATININE 3.66*  LATICACIDVEN 1.2    Estimated Creatinine Clearance: 16.2 mL/min (by C-G formula based on Cr of 3.66).    Allergies  Allergen Reactions  . Compazine [Prochlorperazine Edisylate] Nausea And Vomiting     Thank you for allowing pharmacy to be a part of this patient's care.  Carola FrostNathan A Jacques Willingham, Pharm.D., BCPS Clinical Pharmacist 04/03/2016 12:22 AM

## 2016-04-03 NOTE — Consult Note (Signed)
[facility]  CONSULT NOTE    Date: 04/03/2016                  Patient Name:  Danielle PatchesRebecca A [name]  MRN: [id number]  DOB: 1956-02-14  Age / Sex: 60 y.o., female         PCP: No primary care provider on file.                 Service Requesting Consult: Dr. Amado CoeGouru                 Reason for Consult: Acute Renal Failure            History of Present Illness: [name] is a 60 y.o. white  female with GERD, osteoarthritis and hyperlipidemia, who [facility] for Dehydration [E86.0] UTI (lower urinary tract infection) [N39.0] Acute renal failure, unspecified acute renal failure type (HCC) [N17.9]   Patient has not been feeling well for last 1 week. Several family members at bedside who assist with history taking. Patient was seen by a pain doctor recently, [facility]. Started on narcotic agents recently. Creatinine on 4/12 was 0.69.  Recently started on meloxicam.    Medications: Outpatient medications: Prescriptions prior to admission  Medication Sig Dispense Refill Last Dose  . albuterol (PROVENTIL HFA;VENTOLIN HFA) 108 (90 Base) MCG/ACT inhaler Inhale 2 puffs into the lungs every 6 (six) hours as needed for wheezing or shortness of breath.   PRN at PRN  . ALPRAZolam (XANAX) 0.5 MG tablet Take 0.5 mg by mouth at bedtime as needed for anxiety.   PRN at PRN  . desvenlafaxine (PRISTIQ) 50 MG 24 hr tablet Take 50 mg by mouth daily.   04/02/2016 at Unknown time  . meloxicam (MOBIC) 15 MG tablet Take 15 mg by mouth daily.   04/02/2016 at Unknown time  . pantoprazole (PROTONIX) 40 MG tablet Take 40 mg by mouth daily.   04/02/2016 at Unknown time  . tiZANidine (ZANAFLEX) 4 MG tablet Take 4 mg by mouth at bedtime.   04/01/2016 at Unknown time  . traMADol (ULTRAM) 50 MG tablet Take 50 mg by mouth every 6 (six) hours as needed.   PRN at PRN    Current medications: Current Facility-Administered Medications   Medication Dose Route Frequency Provider Last Rate Last Dose  . 0.9 %  sodium chloride infusion   Intravenous Continuous Lamont DowdySarath Robynne Roat, MD 125 mL/hr at 04/03/16 1029    . acetaminophen (TYLENOL) tablet 650 mg  650 mg Oral Q6H PRN Ihor AustinPavan Pyreddy, MD       Or  . acetaminophen (TYLENOL) suppository 650 mg  650 mg Rectal Q6H PRN Pavan Pyreddy, MD      . albuterol (PROVENTIL) (2.5 MG/3ML) 0.083% nebulizer solution 3 mL  3 mL Inhalation Q6H PRN Ihor AustinPavan Pyreddy, MD      . ALPRAZolam Prudy Feeler(XANAX) tablet 0.5 mg  0.5 mg Oral QHS PRN Pavan Pyreddy, MD      . cefTRIAXone (ROCEPHIN) 1 g in dextrose 5 % 50 mL IVPB  1 g Intravenous Daily Ihor AustinPavan Pyreddy, MD   1 g at 04/03/16 0227  . diphenhydrAMINE (BENADRYL) capsule 25 mg  25 mg Oral Q8H PRN Ramonita LabAruna Gouru, MD      . heparin injection 5,000 Units  5,000 Units Subcutaneous 3 times per day Ihor AustinPavan Pyreddy, MD   5,000 Units at 04/03/16 0512  . ondansetron (ZOFRAN) tablet 4  mg  4 mg Oral Q6H PRN Saundra Shelling, MD       Or  . ondansetron (ZOFRAN) injection 4 mg  4 mg Intravenous Q6H PRN Pavan Pyreddy, MD      . pantoprazole (PROTONIX) EC tablet 40 mg  40 mg Oral QAC breakfast Saundra Shelling, MD   40 mg at 04/03/16 0827  . potassium chloride (KLOR-CON) packet 40 mEq  40 mEq Oral Once Nicholes Mango, MD      . senna-docusate (Senokot-S) tablet 1 tablet  1 tablet Oral QHS PRN Pavan Pyreddy, MD      . sodium chloride 0.9 % bolus 500 mL  500 mL Intravenous Once Schuyler Amor, MD   Stopped at 04/03/16 0055  . tiZANidine (ZANAFLEX) tablet 4 mg  4 mg Oral QHS Saundra Shelling, MD   4 mg at 04/03/16 0203  . venlafaxine XR (EFFEXOR-XR) 24 hr capsule 37.5 mg  37.5 mg Oral Q breakfast Saundra Shelling, MD   37.5 mg at 04/03/16 0827      Allergies: Allergies  Allergen Reactions  . Compazine [Prochlorperazine Edisylate] Nausea And Vomiting      Past Medical History: Past Medical History  Diagnosis Date  . GERD (gastroesophageal reflux disease)   . Osteoarthritis      Past Surgical  History: Past Surgical History  Procedure Laterality Date  . Breast excisional biopsy Left     1981 cyst removed  . Ovarian cyst surgery       Family History: Family History  Problem Relation Age of Onset  . Breast cancer Neg Hx      Social History: Social History   Social History  . Marital Status: Married    Spouse Name: N/A  . Number of Children: N/A  . Years of Education: N/A   Occupational History  . unemployed    Social History Main Topics  . Smoking status: Never Smoker   . Smokeless tobacco: Not on file  . Alcohol Use: No  . Drug Use: No  . Sexual Activity: Not on file   Other Topics Concern  . Not on file   Social History Narrative     Review of Systems: Review of Systems  Constitutional: Positive for fever, chills, malaise/fatigue and diaphoresis. Negative for weight loss.  HENT: Negative.  Negative for congestion, ear discharge, ear pain, hearing loss, nosebleeds, sore throat and tinnitus.   Eyes: Negative.  Negative for blurred vision, double vision, photophobia, pain, discharge and redness.  Respiratory: Negative.  Negative for cough, hemoptysis, sputum production, shortness of breath, wheezing and stridor.   Cardiovascular: Negative.  Negative for chest pain, palpitations, orthopnea, claudication, leg swelling and PND.  Gastrointestinal: Positive for heartburn and constipation. Negative for nausea, vomiting, abdominal pain, diarrhea, blood in stool and melena.  Genitourinary: Negative.  Negative for dysuria, urgency, frequency, hematuria and flank pain.  Musculoskeletal: Positive for myalgias and joint pain. Negative for back pain, falls and neck pain.  Skin: Positive for itching and rash.  Neurological: Positive for weakness. Negative for dizziness, tingling, tremors, sensory change, speech change, focal weakness, seizures, loss of consciousness and headaches.  Endo/Heme/Allergies: Negative.  Negative for environmental allergies and polydipsia.  Does not bruise/bleed easily.  Psychiatric/Behavioral: Negative for depression, suicidal ideas, hallucinations, memory loss and substance abuse. The patient is nervous/anxious. The patient does not have insomnia.     Vital Signs: Blood pressure 137/63, pulse 92, temperature 98.2 F (36.8 C), temperature source Oral, resp. rate 16, height 5\' 1"  (1.549 m), weight 92.579  kg (204 lb 1.6 oz), SpO2 92 %.  Weight trends: Filed Weights   04/02/16 1914 04/03/16 0139  Weight: 87.544 kg (193 lb) 92.579 kg (204 lb 1.6 oz)    Physical Exam: General: NAD, laying in bed  Head: Normocephalic, atraumatic. Moist oral mucosal membranes  Eyes: Anicteric, PERRL  Neck: Supple, trachea midline  Lungs:  Clear to auscultation  Heart: Regular rate and rhythm  Abdomen:  Soft, nontender,   Extremities: no peripheral edema.  Neurologic: Nonfocal, moving all four extremities  Skin: No lesions        Lab results: Basic Metabolic Panel:  Recent Labs Lab 04/02/16 1916 04/03/16 0158  NA 134* 135  K 3.9 3.3*  CL 102 103  CO2 18* 20*  GLUCOSE 120* 109*  BUN 69* 70*  CREATININE 3.66* 3.89*  CALCIUM 8.2* 7.9*    Liver Function Tests:  Recent Labs Lab 04/02/16 1916  AST 27  ALT 40  ALKPHOS 164*  BILITOT 0.2*  PROT 7.2  ALBUMIN 3.2*   No results for input(s): LIPASE, AMYLASE in the last 168 hours. No results for input(s): AMMONIA in the last 168 hours.  CBC:  Recent Labs Lab 04/02/16 1916 04/03/16 0158  WBC 27.6* 25.0*  NEUTROABS 25.8*  --   HGB 10.5* 9.2*  HCT 32.0* 27.6*  MCV 87.2 88.3  PLT 234 214    Cardiac Enzymes:  Recent Labs Lab 04/02/16 1916  TROPONINI 0.06*    BNP: Invalid input(s): POCBNP  CBG: No results for input(s): GLUCAP in the last 168 hours.  Microbiology: Results for orders placed or performed during the hospital encounter of 04/02/16  Culture, blood (routine x 2)     Status: None (Preliminary result)   Collection Time: 04/02/16  7:16 PM  Result  Value Ref Range Status   Specimen Description BLOOD LEFT ASSIST CONTROL  Final   Special Requests BOTTLES DRAWN AEROBIC AND ANAEROBIC [place]  Final   Culture NO GROWTH < 24 HOURS  Final   Report Status PENDING  Incomplete  Culture, blood (routine x 2)     Status: None (Preliminary result)   Collection Time: 04/02/16  7:16 PM  Result Value Ref Range Status   Specimen Description BLOOD RIGHT HAND  Final   Special Requests BOTTLES DRAWN AEROBIC AND ANAEROBIC [place]  Final   Culture  Setup Time   Final    GRAM POSITIVE COCCI IN BOTH AEROBIC AND ANAEROBIC BOTTLES Organism ID to follow    Culture   Final    GRAM POSITIVE COCCI IN BOTH AEROBIC AND ANAEROBIC BOTTLES IDENTIFICATION TO FOLLOW    Report Status PENDING  Incomplete  Urine culture     Status: None (Preliminary result)   Collection Time: 04/02/16  7:16 PM  Result Value Ref Range Status   Specimen Description URINE, RANDOM  Final   Special Requests NONE  Final   Culture NO GROWTH < 12 HOURS  Final   Report Status PENDING  Incomplete    Coagulation Studies: No results for input(s): LABPROT, INR in the last 72 hours.  Urinalysis:  Recent Labs  04/02/16 1916  COLORURINE AMBER*  LABSPEC 1.016  PHURINE 5.0  GLUCOSEU 50*  HGBUR 2+*  BILIRUBINUR NEGATIVE  KETONESUR NEGATIVE  PROTEINUR 100*  NITRITE NEGATIVE  LEUKOCYTESUR 1+*      Imaging: Dg Chest 2 View  04/02/2016  CLINICAL DATA:  Altered mental status.  Wheezing. EXAM: CHEST  2 VIEW COMPARISON:  06/04/2013 FINDINGS: Slight increase of left hemidiaphragm with linear  atelectasis in the left lung base. No focal consolidation or airspace disease. No blunting of costophrenic angles. No pneumothorax. Normal heart size and pulmonary vascularity. Degenerative changes in the spine and shoulders. IMPRESSION: Elevation of left hemidiaphragm with slight linear atelectasis in the left lung base. Electronically Signed   By: Lucienne Capers M.D.   On: 04/02/2016  21:20   Dg Hand Complete Right  04/02/2016  CLINICAL DATA:  Patient with metacarpal pain after hitting hand on the porch railing. Initial encounter. EXAM: RIGHT HAND - COMPLETE 3+ VIEW COMPARISON:  Hand radiograph 07/16/2013 FINDINGS: Normal anatomic alignment. No evidence for acute fracture or dislocation. Soft tissue swelling about the dorsum of the hand. IMPRESSION: No acute osseous abnormality. Electronically Signed   By: Lovey Newcomer M.D.   On: 04/02/2016 21:22      Assessment & Plan: Ms. BLONDINE HOTTEL is a 60 y.o. white  female with GERD, osteoarthritis and hyperlipidemia, who was admitted to [facility] on 04/02/2016   1. Acute renal failure with proteinuria and hematuria: baseline creatinine of 0.69 from 4/12. On meloxicam at home. Admitted with creatinine of 3.66 and rose to 3.89. Nephrology consulted. Foley catheter placed today yielding over 617mL of urine.  - Agree with indwelling foley. Continue to monitor volume status and urine output.  - Decrease IV fluids as patient is starting to have signs of fluid overload. - Check SPEP/UPEP, ANA, ANCA, anti-GBM, hepatitis panel and serum complements.  - Pending renal ultrasound.  - No acute indication for dialysis. Renally dose all medications. Holding nephrotoxic agents including meloxicam and other NSAIDs.   2. Urinary Tract Infection: patient was asymptomatic with dysuria, fevers and chills on admission. However now with septic arthritis of right wrist.  - Urine culture pending.  - empiric ceftriaxone  3. Hyperlipidemia: not currently on a lipid lowering agent   LOS: 0 Serenity Fortner 4/17/20171:18 PM

## 2016-04-03 NOTE — Consult Note (Signed)
Patient is seen for evaluation of right hand swelling. She is somewhat drowsy and does not appear completely awake for this discussion. She thinks she may have hit it before she came in to the hospital against a rail or maybe had a bug bite she not certain. On exam she has swelling to the dorsal and volar aspect of the first webspace with slight erythema. She does not have a great deal of pain with flexion and extension passive or active. She does have diffuse tenderness over the radial side of the hand. At this time she is neurovascular intact and there is no drainage. X-rays previously taken show no evidence of fracture or any significant degenerative changes other than some mild CMC thumb arthritis.  Impression cellulitis right hand probably from a mild penetrating injury from splinter or metal possibly from a bug or animal bite. I think this is an indolent infection and should resolve with IV antibiotics. If it does not and abscess forms she might require incision and drainage.  Plan: Willl follow patient and see that this improves over time or if it does not affect and abscess forms it might require drainage or aspiration

## 2016-04-03 NOTE — Progress Notes (Signed)
Place foley catheter per Dr. Amado CoeGouru okay to place order

## 2016-04-03 NOTE — Progress Notes (Signed)
[facility] -  at [facility]   PATIENT NAME: Stefano GaulRebecca Montoro    MR#:  [id number]  DATE OF BIRTH:  1956-07-11  SUBJECTIVE:  CHIEF COMPLAINT:  Patient is resting comfortably. Not sure why her right hand is swollen. Denies any bug bites or insect bites. Tired and feels sleepy but cannot fall asleep. Asking Benadryl as needed. Sister at bedside  REVIEW OF SYSTEMS:  CONSTITUTIONAL: No fever, fatigue or weakness.  EYES: No blurred or double vision.  EARS, NOSE, AND THROAT: No tinnitus or ear pain.  RESPIRATORY: No cough, shortness of breath, wheezing or hemoptysis.  CARDIOVASCULAR: No chest pain, orthopnea, edema.  GASTROINTESTINAL: No nausea, vomiting, diarrhea or abdominal pain.  GENITOURINARY: No dysuria, hematuria.  ENDOCRINE: No polyuria, nocturia,  HEMATOLOGY: No anemia, easy bruising or bleeding SKIN: No rash or lesion. MUSCULOSKELETAL: Has chronic arthritis. Currently right hand and wrist joints are swollen  NEUROLOGIC: No tingling, numbness, weakness.  PSYCHIATRY: No anxiety or depression.   DRUG ALLERGIES:   Allergies  Allergen Reactions  . Compazine [Prochlorperazine Edisylate] Nausea And Vomiting    VITALS:  Blood pressure 137/63, pulse 92, temperature 98.2 F (36.8 C), temperature source Oral, resp. rate 16, height 5\' 1"  (1.549 m), weight 92.579 kg (204 lb 1.6 oz), SpO2 92 %.  PHYSICAL EXAMINATION:  GENERAL:  60 y.o.-year-old patient lying in the bed with no acute distress.  EYES: Pupils equal, round, reactive to light and accommodation. No scleral icterus. Extraocular muscles intact.  HEENT: Head atraumatic, normocephalic. Oropharynx and nasopharynx clear.  NECK:  Supple, no jugular venous distention. No thyroid enlargement, no tenderness.  LUNGS: Normal breath sounds bilaterally, no wheezing, rales,rhonchi or crepitation. No use of accessory muscles of respiration.  CARDIOVASCULAR: S1, S2 normal. No murmurs, rubs, or gallops.   ABDOMEN: Soft, nontender, nondistended. Bowel sounds present. No organomegaly or mass.  EXTREMITIES: Right wrist joint and dorsal aspect of the hand are edematous and tender with decreased range of motion, radial pulse is 1-2+ .No pedal edema, cyanosis, or clubbing.  NEUROLOGIC: Cranial nerves II through XII are intact. Muscle strength 5/5 in all extremities. Sensation intact. Gait not checked.  PSYCHIATRIC: The patient is alert and oriented x 3.  SKIN: No obvious rash, lesion, or ulcer.    LABORATORY PANEL:   CBC  Recent Labs Lab 04/03/16 0158  WBC 25.0*  HGB 9.2*  HCT 27.6*  PLT 214   ------------------------------------------------------------------------------------------------------------------  Chemistries   Recent Labs Lab 04/02/16 1916 04/03/16 0158  NA 134* 135  K 3.9 3.3*  CL 102 103  CO2 18* 20*  GLUCOSE 120* 109*  BUN 69* 70*  CREATININE 3.66* 3.89*  CALCIUM 8.2* 7.9*  AST 27  --   ALT 40  --   ALKPHOS 164*  --   BILITOT 0.2*  --    ------------------------------------------------------------------------------------------------------------------  Cardiac Enzymes  Recent Labs Lab 04/02/16 1916  TROPONINI 0.06*   ------------------------------------------------------------------------------------------------------------------  RADIOLOGY:  Dg Chest 2 View  04/02/2016  CLINICAL DATA:  Altered mental status.  Wheezing. EXAM: CHEST  2 VIEW COMPARISON:  06/04/2013 FINDINGS: Slight increase of left hemidiaphragm with linear atelectasis in the left lung base. No focal consolidation or airspace disease. No blunting of costophrenic angles. No pneumothorax. Normal heart size and pulmonary vascularity. Degenerative changes in the spine and shoulders. IMPRESSION: Elevation of left hemidiaphragm with slight linear atelectasis in the left lung base. Electronically Signed   By: Burman NievesWilliam  Stevens M.D.   On: 04/02/2016 21:20   Dg Hand Complete Right  04/02/2016   CLINICAL DATA:  Patient with metacarpal pain after hitting hand on the porch railing. Initial encounter. EXAM: RIGHT HAND - COMPLETE 3+ VIEW COMPARISON:  Hand radiograph 07/16/2013 FINDINGS: Normal anatomic alignment. No evidence for acute fracture or dislocation. Soft tissue swelling about the dorsum of the hand. IMPRESSION: No acute osseous abnormality. Electronically Signed   By: Annia Belt M.D.   On: 04/02/2016 21:22    EKG:   Orders placed or performed during the hospital encounter of 04/02/16  . EKG 12-Lead  . EKG 12-Lead    ASSESSMENT AND PLAN:   60 year old female patient with history of osteoarthritis, GERD, back pain presented to the emergency room with generalized weakness and transient disorientation.  # Acute kidney injury probably from dehydration from poor by mouth intake Will provide IV fluids and monitor renal function closely. Avoid nephrotoxins   Get renal ultrasound and Foley catheter placement for monitoring output   nephrology consult is placed and check a.m. Labs Blood cultures with no growth so far  #. Urinary tract infection Continue IV fluids, IV Rocephin.  Urine cultures are negative so far   #. Leukocytosis which could be secondary to  UTI/steroids Check CBC in a.m.   #Right hand swelling -etiology unclear  Hand x-ray with no acute findings Provide pain management and consult is placed to orthopedics discussed with Dr.Menz  5. Osteoarthritis Pain medications as needed   6. GERD-PPI   DVT prophylaxis with subcutaneous heparin     All the records are reviewed and case discussed with Care Management/Social Workerr. Management plans discussed with the patient, and sister and they are in agreement.  CODE STATUS: fc  TOTAL TIME TAKING CARE OF THIS PATIENT: 35 minutes.   POSSIBLE D/C IN 2-3 DAYS, DEPENDING ON CLINICAL CONDITION.   Ramonita Lab M.D on 04/03/2016 at 12:12 PM  Between 7am to 6pm - Pager - [phone] After 6pm go to  [email or web address] - password [email or web address] [facility]  [facility]  Office  [phone]  CC: Primary care physician; No primary care provider on file.

## 2016-04-03 NOTE — Evaluation (Signed)
Physical Therapy Evaluation Patient Details Name: Danielle [name] MRN: [id number] DOB: [birth year] Today's Date: 04/03/2016   History of Present Illness  Danielle [name] is a 60 y.o. female with a known history of Osteoarthritis, GERD presented to the emergency room because of weakness and somnolence. Patient had been recently seeing the orthopedic doctor and also [facility] for low back pain and she is on pain medication and also completed her dose of oral steroids. Her last day of oral steroids for this today. According to the family members patient has been more lethargic and somnolent after being on pain medication. Patient is awake and alert and responds to all verbal commands in the emergency room today. Has history of dysuria. No history of any chest pain or shortness of breath. Has generalized weakness. Upon evaluation the emergency room she appeared to be dehydrated and also her creatinine was elevated. Patient was started on IV fluids and the received a IV antibiotic in the emergency room for possible urinary tract infection.No complaints of cough. No history or recent travel or sick contacts at home. Pt reports 1 fall in the last 12 month.  Clinical Impression  Pt demonstrates functional weakness with bed mobility and transfers. She has poor balance in standing and falls immediately onto bed while trying to step forward/backward at EOB. Pt is unable to use R hand/wrist due to pain and reports increase in pain with all attempts at AROM of L shoulder. Pt complaining of severe L shoulder pain which is acute over the last couple days. Pt denies L shoulder trauma but is very lethargic during session and intermittently confused. Pt will need SNF placement at discharge. Pt will benefit from skilled PT services to address deficits in strength, balance, and mobility in order to return to full function at home.     Follow Up Recommendations SNF    Equipment Recommendations  Rolling walker with 5"  wheels;Other (comment) (TBD at SNF)    Recommendations for Other Services       Precautions / Restrictions Precautions Precautions: Fall Restrictions Weight Bearing Restrictions: No      Mobility  Bed Mobility Overal bed mobility: Needs Assistance Bed Mobility: Supine to Sit;Sit to Supine     Supine to sit: Mod assist Sit to supine: Mod assist   General bed mobility comments: Pt requires heavy modA+1 to assist with legs as well as trunk righting to go from R sidelying to sitting. Pt complains of severe L shoulder pain with mobility as well as R hand/wrist pain.   Transfers Overall transfer level: Needs assistance Equipment used: Rolling walker (2 wheeled) Transfers: Sit to/from Stand Sit to Stand: Min assist         General transfer comment: Pt requires heavy cues for hand placement and proper sequencing with transfers. She is unsteady in standing but eventually able to maintain balance with UE support and CGA only. Pt able to perform standing marches with minA+1 for balance. Attempted small forward step but pt loses balance and falls back onto bed. Unable to ambulate at this time.  Ambulation/Gait             General Gait Details: Unable to ambulate at this time  Stairs            Wheelchair Mobility    Modified Rankin (Stroke Patients Only)       Balance Overall balance assessment: Needs assistance Sitting-balance support: No upper extremity supported Sitting balance-Leahy Scale: Fair  Standing balance-Leahy Scale: Poor                               Pertinent Vitals/Pain Pain Assessment: 0-10 Pain Location: Will not rate but reports "medium" low back pain Pain Intervention(s): Monitored during session    Home Living Family/patient expects to be discharged to:: Private residence Living Arrangements: Children;Spouse/significant other;Other (Comment) (Daughter and her boyfriend living in camper in backyard) Available Help at  Discharge: Family Type of Home: House Home Access: Stairs to enter Entrance Stairs-Rails: None Entrance Stairs-Number of Steps: 1+1 Home Layout: Bed/bath upstairs;Two level Home Equipment: Shower seat - built in;Cane - single point (no wheelchair, no hospital bed, no home O2)      Prior Function Level of Independence: Independent         Comments: Previously working and driving. Full community ambulator without assistive device     Hand Dominance   Dominant Hand: Right    Extremity/Trunk Assessment   Upper Extremity Assessment: LUE deficits/detail       LUE Deficits / Details: RUE strength grossly WFL. Unable to test wrist/hand strength due to redness, swelling, and pain. Pt reports severe pain with attempts at L shoulder flexion. Allows full passive range of motion for flexion of L shoulder but with increased pain at end range. Unable to hold arm at 90 flexion or take resistance due to pain. L elbow and hand strength appears grossly WFL. Poor effor by patient due to pain   Lower Extremity Assessment: Generalized weakness         Communication   Communication: No difficulties  Cognition Arousal/Alertness: Lethargic Behavior During Therapy: Flat affect Overall Cognitive Status: No family/caregiver present to determine baseline cognitive functioning (AOx3, partially oriented to situation. Intermittentconfusion)                      General Comments      Exercises General Exercises - Lower Extremity Ankle Circles/Pumps: Strengthening;Both;10 reps;Supine Quad Sets: Strengthening;Both;10 reps;Supine Gluteal Sets: Strengthening;Both;10 reps;Supine Heel Slides: Strengthening;Both;10 reps;Supine Hip ABduction/ADduction: Strengthening;Both;10 reps;Supine Straight Leg Raises: Strengthening;Both;10 reps;Supine      Assessment/Plan    PT Assessment Patient needs continued PT services  PT Diagnosis Difficulty walking;Abnormality of gait;Generalized weakness    PT Problem List Decreased strength;Decreased activity tolerance;Decreased mobility  PT Treatment Interventions DME instruction;Gait training;Therapeutic activities;Therapeutic exercise;Balance training;Neuromuscular re-education   PT Goals (Current goals can be found in the Care Plan section) Acute Rehab PT Goals Patient Stated Goal: "I want to get better." PT Goal Formulation: With patient Time For Goal Achievement: 04/17/16 Potential to Achieve Goals: Good    Frequency Min 2X/week   Barriers to discharge        Co-evaluation               End of Session Equipment Utilized During Treatment: Gait belt Activity Tolerance: Patient limited by fatigue Patient left: with call bell/phone within reach;in bed;with bed alarm set;Other (comment) (Attempted up to chair but unable) Nurse Communication: Mobility status         Time: [id number] PT Time Calculation (min) (ACUTE ONLY): 36 min   Charges:   PT Evaluation $PT Eval Moderate Complexity: 1 Procedure PT Treatments $Therapeutic Exercise: 8-22 mins   PT G Codes:       Sharalyn InkJason D Abdiel Blackerby PT, DPT   Danielle [name] 04/03/2016, 4:21 PM

## 2016-04-03 NOTE — Progress Notes (Signed)
Per Dr. Wynelle LinkKolluru use single tube of blood for ANA today and will try to draw other tests with morning labs. Will retime tests for 5am tomorrow.

## 2016-04-03 NOTE — Consult Note (Signed)
Pharmacy Antibiotic Note  Danielle [name] is a 60 y.o. female admitted on 04/02/2016 with bacteremia.  Pharmacy has been consulted for cefazolin dosing.  Plan: BCID results: MSSA  BCID results were discussed with Dr. Amado CoeGouru and the patient's antibiotics will be changed to cefazolin 2g q 12 hours due to renal function. Further narrowing will be determined based on finalized susceptibilities.  Height: 5\' 1"  (154.9 cm) Weight: 204 lb 1.6 oz (92.579 kg) IBW/kg (Calculated) : 47.8  Temp (24hrs), Avg:98 F (36.7 C), Min:97.7 F (36.5 C), Max:98.2 F (36.8 C)   Recent Labs Lab 04/02/16 1916 04/03/16 0158  WBC 27.6* 25.0*  CREATININE 3.66* 3.89*  LATICACIDVEN 1.2 1.1    Estimated Creatinine Clearance: 15.8 mL/min (by C-G formula based on Cr of 3.89).    Allergies  Allergen Reactions  . Compazine [Prochlorperazine Edisylate] Nausea And Vomiting    Antimicrobials this admission: ceftriaxone 4/16 >> 4/17 cefazolin 4/17 >>   Dose adjustments this admission:   Microbiology results: Recent Results (from the past 240 hour(s))  Culture, blood (routine x 2)     Status: None (Preliminary result)   Collection Time: 04/02/16  7:16 PM  Result Value Ref Range Status   Specimen Description BLOOD LEFT ASSIST CONTROL  Final   Special Requests BOTTLES DRAWN AEROBIC AND ANAEROBIC 1CCAERO,1CCANA  Final   Culture NO GROWTH < 24 HOURS  Final   Report Status PENDING  Incomplete  Culture, blood (routine x 2)     Status: None (Preliminary result)   Collection Time: 04/02/16  7:16 PM  Result Value Ref Range Status   Specimen Description BLOOD RIGHT HAND  Final   Special Requests BOTTLES DRAWN AEROBIC AND ANAEROBIC 1CCAERO,1CCANA  Final   Culture  Setup Time   Final    GRAM POSITIVE COCCI IN BOTH AEROBIC AND ANAEROBIC BOTTLES Organism ID to follow    Culture   Final    GRAM POSITIVE COCCI IN BOTH AEROBIC AND ANAEROBIC BOTTLES IDENTIFICATION TO FOLLOW    Report Status PENDING  Incomplete   Urine culture     Status: None (Preliminary result)   Collection Time: 04/02/16  7:16 PM  Result Value Ref Range Status   Specimen Description URINE, RANDOM  Final   Special Requests NONE  Final   Culture NO GROWTH < 12 HOURS  Final   Report Status PENDING  Incomplete  Blood Culture ID Panel (Reflexed)     Status: Abnormal   Collection Time: 04/02/16  7:16 PM  Result Value Ref Range Status   Enterococcus species NOT DETECTED NOT DETECTED Final   Vancomycin resistance NOT DETECTED NOT DETECTED Final   Listeria monocytogenes NOT DETECTED NOT DETECTED Final   Staphylococcus species DETECTED (A) NOT DETECTED Final    Comment: CRITICAL RESULT CALLED TO, READ BACK BY AND VERIFIED WITH: KAREN HAYES FOR STAPHYLOCOCCUS SPECIES AND STAPHYLOCOCCUS AUREUS AT 1338 ON 04/03/16 BY CTJ.    Staphylococcus aureus DETECTED (A) NOT DETECTED Final   Methicillin resistance NOT DETECTED NOT DETECTED Final   Streptococcus species NOT DETECTED NOT DETECTED Final   Streptococcus agalactiae NOT DETECTED NOT DETECTED Final   Streptococcus pneumoniae NOT DETECTED NOT DETECTED Final   Streptococcus pyogenes NOT DETECTED NOT DETECTED Final   Acinetobacter baumannii NOT DETECTED NOT DETECTED Final   Enterobacteriaceae species NOT DETECTED NOT DETECTED Final   Enterobacter cloacae complex NOT DETECTED NOT DETECTED Final   Escherichia coli NOT DETECTED NOT DETECTED Final   Klebsiella oxytoca NOT DETECTED NOT DETECTED Final  Klebsiella pneumoniae NOT DETECTED NOT DETECTED Final   Proteus species NOT DETECTED NOT DETECTED Final   Serratia marcescens NOT DETECTED NOT DETECTED Final   Carbapenem resistance NOT DETECTED NOT DETECTED Final   Haemophilus influenzae NOT DETECTED NOT DETECTED Final   Neisseria meningitidis NOT DETECTED NOT DETECTED Final   Pseudomonas aeruginosa NOT DETECTED NOT DETECTED Final   Candida albicans NOT DETECTED NOT DETECTED Final   Candida glabrata NOT DETECTED NOT DETECTED Final   Candida  krusei NOT DETECTED NOT DETECTED Final   Candida parapsilosis NOT DETECTED NOT DETECTED Final   Candida tropicalis NOT DETECTED NOT DETECTED Final     Thank you for allowing pharmacy to be a part of this patient's care.  Olene Floss, Pharm.D Clinical Pharmacist  04/03/2016 2:17 PM

## 2016-04-03 NOTE — ED Notes (Signed)
Pt repositioned for comfort

## 2016-04-04 ENCOUNTER — Inpatient Hospital Stay (HOSPITAL_COMMUNITY)
Admit: 2016-04-04 | Discharge: 2016-04-04 | Disposition: A | Discharge status: Home / Self Care | Payer: Commercial Managed Care - PPO | Attending: Infectious Diseases | Admitting: Infectious Diseases

## 2016-04-04 DIAGNOSIS — R509 Fever, unspecified: Secondary | ICD-10-CM

## 2016-04-04 LAB — RENAL FUNCTION PANEL
ANION GAP: 10 (ref 5–15)
Albumin: 2.5 g/dL — ABNORMAL LOW (ref 3.5–5.0)
BUN: 69 mg/dL — ABNORMAL HIGH (ref 6–20)
CALCIUM: 8.3 mg/dL — AB (ref 8.9–10.3)
CHLORIDE: 108 mmol/L (ref 101–111)
CO2: 16 mmol/L — AB (ref 22–32)
CREATININE: 3.16 mg/dL — AB (ref 0.44–1.00)
GFR, EST AFRICAN AMERICAN: 17 mL/min — AB (ref >60)
GFR, EST NON AFRICAN AMERICAN: 15 mL/min — AB (ref >60)
Glucose, Bld: 87 mg/dL (ref 65–99)
Phosphorus: 5.6 mg/dL — ABNORMAL HIGH (ref 2.5–4.6)
Potassium: 4 mmol/L (ref 3.5–5.1)
Sodium: 134 mmol/L — ABNORMAL LOW (ref 135–145)

## 2016-04-04 LAB — URINE CULTURE

## 2016-04-04 LAB — CBC
HCT: 30.5 % — ABNORMAL LOW (ref 35.0–47.0)
HEMOGLOBIN: 9.8 g/dL — AB (ref 12.0–16.0)
MCH: 28.8 pg (ref 26.0–34.0)
MCHC: 32.2 g/dL (ref 32.0–36.0)
MCV: 89.3 fL (ref 80.0–100.0)
PLATELETS: 252 10*3/uL (ref 150–440)
RBC: 3.42 MIL/uL — AB (ref 3.80–5.20)
RDW: 15.6 % — ABNORMAL HIGH (ref 11.5–14.5)
WBC: 22.2 10*3/uL — AB (ref 3.6–11.0)

## 2016-04-04 LAB — ECHOCARDIOGRAM COMPLETE
Height: 61 in
Weight: 3265.6 oz

## 2016-04-04 LAB — RAPID HIV SCREEN (HIV 1/2 AB+AG)
HIV 1/2 ANTIBODIES: NONREACTIVE
HIV-1 P24 Antigen - HIV24: NONREACTIVE

## 2016-04-04 LAB — PROTEIN ELECTRO, RANDOM URINE
ALBUMIN ELP UR: 21.8 %
ALPHA-1-GLOBULIN, U: 2.4 %
ALPHA-2-GLOBULIN, U: 18.3 %
BETA GLOBULIN, U: 38.6 %
GAMMA GLOBULIN, U: 18.9 %
M Component, Ur: 6 % — ABNORMAL HIGH
TOTAL PROTEIN, URINE-UPE24: 50.4 mg/dL

## 2016-04-04 LAB — ANA W/REFLEX: ANA: NEGATIVE

## 2016-04-04 MED ORDER — GUAIFENESIN ER 600 MG PO TB12
600.0000 mg | ORAL_TABLET | Freq: Two times a day (BID) | ORAL | Status: DC
  Administered 2016-04-04 – 2016-04-10 (×13): 600 mg via ORAL
  Filled 2016-04-04 (×13): qty 1

## 2016-04-04 MED ORDER — SENNOSIDES-DOCUSATE SODIUM 8.6-50 MG PO TABS
1.0000 | ORAL_TABLET | Freq: Two times a day (BID) | ORAL | Status: DC
  Administered 2016-04-04 – 2016-04-10 (×13): 1 via ORAL
  Filled 2016-04-04 (×13): qty 1

## 2016-04-04 NOTE — Clinical Social Work Note (Signed)
Clinical Social Work Assessment  Patient Details  Name: Danielle [name] MRN: [id number] Date of Birth: Sep 15, 1956  Date of referral:  04/04/16               Reason for consult:  Facility Placement                Permission sought to share information with:  Chartered certified accountant granted to share information::  Yes, Verbal Permission Granted  Name::        Agency::     Relationship::     Contact Information:     Housing/Transportation Living arrangements for the past 2 months:  Single Family Home Source of Information:  Patient Patient Interpreter Needed:  None Criminal Activity/Legal Involvement Pertinent to Current Situation/Hospitalization:  No - Comment as needed Significant Relationships:  Adult Children, Spouse Lives with:  Spouse, Adult Children Do you feel safe going back to the place where you live?  Yes Need for family participation in patient care:  Yes (Comment)  Care giving concerns:  Patient resides at home with her husband and two adult children and one grandchild.   Social Worker assessment / plan:  PT has assessed patient and recommended STR. CSW met with patient this afternoon and she stated that she has a great support system at home and she plans to return home. She stated if she cannot walk, then she will have to consider rehab and is willing for CSW to initiate a bedsearch. Patient states she works and is concerned that she will be out of work and does not have enough vacation time or disability through work. Patient is hopeful she will be able to return home.   Employment status:  Therapist, music:  Managed Care PT Recommendations:  [place] / Referral to community resources:  [place]  Patient/Family's Response to care:  Patient expressed appreciation for CSW assistance.   Patient/Family's Understanding of and Emotional Response to Diagnosis, Current Treatment, and Prognosis:   Patient is not wanting to go to rehab and intends to return home. She does have insight into the fact that if she cannot walk, she will need to consider rehab.  Emotional Assessment Appearance:  Appears stated age Attitude/Demeanor/Rapport:   (pleasant and cooperative) Affect (typically observed):  Accepting Orientation:  Oriented to Self, Oriented to Place, Oriented to  Time, Oriented to Situation Alcohol / Substance use:  Not Applicable Psych involvement (Current and /or in the community):  No (Comment)  Discharge Needs  Concerns to be addressed:  Care Coordination Readmission within the last 30 days:  No Current discharge risk:  None Barriers to Discharge:  No Barriers Identified   Shela Leff, LCSW 04/04/2016, 2:50 PM

## 2016-04-04 NOTE — Progress Notes (Signed)
[facility]  ROUNDING NOTE   Subjective:   Family at bedside.  NS at 49  Creatinine 3.16 (3.89)  Objective:  Vital signs in last 24 hours:  Temp:  [97.7 F (36.5 C)-98.7 F (37.1 C)] 98 F (36.7 C) (04/18 0546) Pulse Rate:  [80-103] 80 (04/18 0546) Resp:  [16-20] 16 (04/18 0546) BP: (94-149)/(57-71) 94/57 mmHg (04/18 0546) SpO2:  [93 %-96 %] 96 % (04/18 0546)  Weight change:  Filed Weights   04/02/16 1914 04/03/16 0139  Weight: 87.544 kg (193 lb) 92.579 kg (204 lb 1.6 oz)    Intake/Output: I/O last 3 completed shifts: In: 2363.4 [P.O.:120; I.V.:2243.4] Out: 1625 [Urine:1625]   Intake/Output this shift:  Total I/O In: 334 [I.V.:334] Out: 475 [Urine:475]  Physical Exam: General: NAD,   Head: Normocephalic, atraumatic. Moist oral mucosal membranes  Eyes: Anicteric, PERRL  Neck: Supple, trachea midline  Lungs:  Clear to auscultation  Heart: Regular rate and rhythm  Abdomen:  Soft, nontender,   Extremities: trace peripheral edema. Right wrist cellulitis  Neurologic: Nonfocal, moving all four extremities  Skin: +erythema       Basic Metabolic Panel:  Recent Labs Lab 04/02/16 1916 04/03/16 0158 04/04/16 0509  NA 134* 135 134*  K 3.9 3.3* 4.0  CL 102 103 108  CO2 18* 20* 16*  GLUCOSE 120* 109* 87  BUN 69* 70* 69*  CREATININE 3.66* 3.89* 3.16*  CALCIUM 8.2* 7.9* 8.3*  PHOS  --   --  5.6*    Liver Function Tests:  Recent Labs Lab 04/02/16 1916 04/04/16 0509  AST 27  --   ALT 40  --   ALKPHOS 164*  --   BILITOT 0.2*  --   PROT 7.2  --   ALBUMIN 3.2* 2.5*   No results for input(s): LIPASE, AMYLASE in the last 168 hours. No results for input(s): AMMONIA in the last 168 hours.  CBC:  Recent Labs Lab 04/02/16 1916 04/03/16 0158 04/04/16 0509  WBC 27.6* 25.0* 22.2*  NEUTROABS 25.8*  --   --   HGB 10.5* 9.2* 9.8*  HCT 32.0* 27.6* 30.5*  MCV 87.2 88.3 89.3  PLT 234 214 252    Cardiac Enzymes:  Recent Labs Lab  04/02/16 1916  TROPONINI 0.06*    BNP: Invalid input(s): POCBNP  CBG: No results for input(s): GLUCAP in the last 168 hours.  Microbiology: Results for orders placed or performed during the hospital encounter of 04/02/16  Culture, blood (routine x 2)     Status: Abnormal (Preliminary result)   Collection Time: 04/02/16  7:16 PM  Result Value Ref Range Status   Specimen Description BLOOD LEFT ASSIST CONTROL  Final   Special Requests BOTTLES DRAWN AEROBIC AND ANAEROBIC 1CCAERO,1CCANA  Final   Culture (A)  Final    STAPHYLOCOCCUS AUREUS IN BOTH AEROBIC AND ANAEROBIC BOTTLES SUSCEPTIBILITIES TO FOLLOW    Report Status PENDING  Incomplete  Culture, blood (routine x 2)     Status: Abnormal (Preliminary result)   Collection Time: 04/02/16  7:16 PM  Result Value Ref Range Status   Specimen Description BLOOD RIGHT HAND  Final   Special Requests BOTTLES DRAWN AEROBIC AND ANAEROBIC 1CCAERO,1CCANA  Final   Culture  Setup Time   Final    GRAM POSITIVE COCCI IN BOTH AEROBIC AND ANAEROBIC BOTTLES CRITICAL RESULT CALLED TO, READ BACK BY AND VERIFIED WITH: KAREN HAYES AT 1338 ON 04/03/16 BY CTJ.    Culture (A)  Final    STAPHYLOCOCCUS AUREUS IN  BOTH AEROBIC AND ANAEROBIC BOTTLES SUSCEPTIBILITIES TO FOLLOW    Report Status PENDING  Incomplete  Urine culture     Status: None   Collection Time: 04/02/16  7:16 PM  Result Value Ref Range Status   Specimen Description URINE, RANDOM  Final   Special Requests NONE  Final   Culture MULTIPLE SPECIES PRESENT, SUGGEST RECOLLECTION  Final   Report Status 04/04/2016 FINAL  Final  Blood Culture ID Panel (Reflexed)     Status: Abnormal   Collection Time: 04/02/16  7:16 PM  Result Value Ref Range Status   Enterococcus species NOT DETECTED NOT DETECTED Final   Vancomycin resistance NOT DETECTED NOT DETECTED Final   Listeria monocytogenes NOT DETECTED NOT DETECTED Final   Staphylococcus species DETECTED (A) NOT DETECTED Final    Comment: CRITICAL  RESULT CALLED TO, READ BACK BY AND VERIFIED WITH: KAREN HAYES FOR STAPHYLOCOCCUS SPECIES AND STAPHYLOCOCCUS AUREUS AT 1338 ON 04/03/16 BY CTJ.    Staphylococcus aureus DETECTED (A) NOT DETECTED Final   Methicillin resistance NOT DETECTED NOT DETECTED Final   Streptococcus species NOT DETECTED NOT DETECTED Final   Streptococcus agalactiae NOT DETECTED NOT DETECTED Final   Streptococcus pneumoniae NOT DETECTED NOT DETECTED Final   Streptococcus pyogenes NOT DETECTED NOT DETECTED Final   Acinetobacter baumannii NOT DETECTED NOT DETECTED Final   Enterobacteriaceae species NOT DETECTED NOT DETECTED Final   Enterobacter cloacae complex NOT DETECTED NOT DETECTED Final   Escherichia coli NOT DETECTED NOT DETECTED Final   Klebsiella oxytoca NOT DETECTED NOT DETECTED Final   Klebsiella pneumoniae NOT DETECTED NOT DETECTED Final   Proteus species NOT DETECTED NOT DETECTED Final   Serratia marcescens NOT DETECTED NOT DETECTED Final   Carbapenem resistance NOT DETECTED NOT DETECTED Final   Haemophilus influenzae NOT DETECTED NOT DETECTED Final   Neisseria meningitidis NOT DETECTED NOT DETECTED Final   Pseudomonas aeruginosa NOT DETECTED NOT DETECTED Final   Candida albicans NOT DETECTED NOT DETECTED Final   Candida glabrata NOT DETECTED NOT DETECTED Final   Candida krusei NOT DETECTED NOT DETECTED Final   Candida parapsilosis NOT DETECTED NOT DETECTED Final   Candida tropicalis NOT DETECTED NOT DETECTED Final    Coagulation Studies: No results for input(s): LABPROT, INR in the last 72 hours.  Urinalysis:  Recent Labs  04/02/16 1916  COLORURINE AMBER*  LABSPEC 1.016  PHURINE 5.0  GLUCOSEU 50*  HGBUR 2+*  BILIRUBINUR NEGATIVE  KETONESUR NEGATIVE  PROTEINUR 100*  NITRITE NEGATIVE  LEUKOCYTESUR 1+*      Imaging: Dg Chest 2 View  04/02/2016  CLINICAL DATA:  Altered mental status.  Wheezing. EXAM: CHEST  2 VIEW COMPARISON:  06/04/2013 FINDINGS: Slight increase of left hemidiaphragm  with linear atelectasis in the left lung base. No focal consolidation or airspace disease. No blunting of costophrenic angles. No pneumothorax. Normal heart size and pulmonary vascularity. Degenerative changes in the spine and shoulders. IMPRESSION: Elevation of left hemidiaphragm with slight linear atelectasis in the left lung base. Electronically Signed   By: Burman Nieves M.D.   On: 04/02/2016 21:20   US Renal  04/03/2016  CLINICAL DATA:  Acute kidney injury. EXAM: RENAL / URINARY TRACT ULTRASOUND COMPLETE COMPARISON:  None. FINDINGS: Right Kidney: Length: 10.0 cm. Echogenicity within normal limits. No mass or hydronephrosis visualized. Left Kidney: Length: 9.8 cm. Echogenicity within normal limits. No mass or hydronephrosis visualized. Bladder: Appears normal for degree of bladder distention ; the bladder is nondistended due to the presence of a urinary bladder catheter. IMPRESSION: 1.  No hydronephrosis or specific sonographic abnormality of the kidneys or urinary bladder identified. Electronically Signed   By: Gaylyn Rong M.D.   On: 04/03/2016 14:41   Dg Hand Complete Right  04/02/2016  CLINICAL DATA:  Patient with metacarpal pain after hitting hand on the porch railing. Initial encounter. EXAM: RIGHT HAND - COMPLETE 3+ VIEW COMPARISON:  Hand radiograph 07/16/2013 FINDINGS: Normal anatomic alignment. No evidence for acute fracture or dislocation. Soft tissue swelling about the dorsum of the hand. IMPRESSION: No acute osseous abnormality. Electronically Signed   By: Annia Belt M.D.   On: 04/02/2016 21:22     Medications:   . sodium chloride 50 mL/hr at 04/04/16 0137   .  ceFAZolin (ANCEF) IV  2 g Intravenous Q12H  . guaiFENesin  600 mg Oral BID  . heparin  5,000 Units Subcutaneous 3 times per day  . pantoprazole  40 mg Oral QAC breakfast  . senna-docusate  1 tablet Oral BID  . sodium chloride  500 mL Intravenous Once  . tiZANidine  4 mg Oral QHS  . venlafaxine XR  37.5 mg Oral Q  breakfast   acetaminophen **OR** acetaminophen, albuterol, ALPRAZolam, diphenhydrAMINE, ondansetron **OR** ondansetron (ZOFRAN) IV  Assessment/ Plan:  [name] is a 60 y.o. white female with GERD, osteoarthritis and hyperlipidemia, who was admitted to [facility] on 04/02/2016   1. Acute renal failure with proteinuria and hematuria: baseline creatinine of 0.69 from 4/12. On meloxicam at home. Seems to have been given toradol at [facility] ED last week.  Admitted with creatinine of 3.66 and rose to 3.89. Nephrology consulted. Foley catheter placed today yielding over of urine.  - indwelling foley. Continue to monitor volume status and urine output.  - Continue NS at 50mL - serologies pending - Renal ultrasound reviewed with patietn.  - No acute indication for dialysis. Renally dose all medications. Holding nephrotoxic agents including meloxicam and other NSAIDs.   2. Urinary Tract Infection: patient was asymptomatic with dysuria, fevers and chills on admission. However now with septic arthritis of right wrist.  - Urine culture pending. Blood with staph - empiric ceftriaxone   LOS: 1 Renly Guedes 4/18/201712:43 PM

## 2016-04-04 NOTE — Progress Notes (Signed)
[facility] [place] [facility]   PATIENT NAME: Danielle GaulRebecca [name]    MR#:  [id number]  DATE OF BIRTH:  1956-06-01  SUBJECTIVE:   Patient is resting comfortably. . Denies any bug bites or insect bites. weak  REVIEW OF SYSTEMS:  CONSTITUTIONAL: No fever, fatigue or weakness.  EYES: No blurred or double vision.  EARS, NOSE, AND THROAT: No tinnitus or ear pain.  RESPIRATORY: No cough, shortness of breath, wheezing or hemoptysis.  CARDIOVASCULAR: No chest pain, orthopnea, edema.  GASTROINTESTINAL: No nausea, vomiting, diarrhea or abdominal pain.  GENITOURINARY: No dysuria, hematuria.  ENDOCRINE: No polyuria, nocturia,  HEMATOLOGY: No anemia, easy bruising or bleeding SKIN: No rash or lesion. MUSCULOSKELETAL: Has chronic arthritis. Currently right hand and wrist joints are swollen  NEUROLOGIC: No tingling, numbness, weakness.  PSYCHIATRY: No anxiety or depression.   DRUG ALLERGIES:   Allergies  Allergen Reactions  . Compazine [Prochlorperazine Edisylate] Nausea And Vomiting    VITALS:  Blood pressure 124/56, pulse 89, temperature 98.1 F (36.7 C), temperature source Oral, resp. rate 18, height 5\' 1"  (1.549 m), weight 92.579 kg (204 lb 1.6 oz), SpO2 97 %.  PHYSICAL EXAMINATION:  GENERAL:  60 y.o.-year-old patient lying in the bed with no acute distress.  EYES: Pupils equal, round, reactive to light and accommodation. No scleral icterus. Extraocular muscles intact.  HEENT: Head atraumatic, normocephalic. Oropharynx and nasopharynx clear.  NECK:  Supple, no jugular venous distention. No thyroid enlargement, no tenderness.  LUNGS: Normal breath sounds bilaterally, no wheezing, rales,rhonchi or crepitation. No use of accessory muscles of respiration.  CARDIOVASCULAR: S1, S2 normal. No murmurs, rubs, or gallops.  ABDOMEN: Soft, nontender, nondistended. Bowel sounds present. No organomegaly or mass.  EXTREMITIES: Right wrist joint and dorsal aspect of the hand  are edematous and tender with decreased range of motion, radial pulse is 1-2+ .No pedal edema, cyanosis, or clubbing.  NEUROLOGIC: Cranial nerves II through XII are intact. Muscle strength 5/5 in all extremities. Sensation intact. Gait not checked.  PSYCHIATRIC: The patient is alert and oriented x 3.  SKIN: No obvious rash, lesion, or ulcer.    LABORATORY PANEL:   CBC  Recent Labs Lab 04/04/16 0509  WBC 22.2*  HGB 9.8*  HCT 30.5*  PLT 252   ------------------------------------------------------------------------------------------------------------------  Chemistries   Recent Labs Lab 04/02/16 1916  04/04/16 0509  NA 134*  < > 134*  K 3.9  < > 4.0  CL 102  < > 108  CO2 18*  < > 16*  GLUCOSE 120*  < > 87  BUN 69*  < > 69*  CREATININE 3.66*  < > 3.16*  CALCIUM 8.2*  < > 8.3*  AST 27  --   --   ALT 40  --   --   ALKPHOS 164*  --   --   BILITOT 0.2*  --   --   < > = values in this interval not displayed. ------------------------------------------------------------------------------------------------------------------  Cardiac Enzymes  Recent Labs Lab 04/02/16 1916  TROPONINI 0.06*   ------------------------------------------------------------------------------------------------------------------  RADIOLOGY:  Dg Chest 2 View  04/02/2016  CLINICAL DATA:  Altered mental status.  Wheezing. EXAM: CHEST  2 VIEW COMPARISON:  06/04/2013 FINDINGS: Slight increase of left hemidiaphragm with linear atelectasis in the left lung base. No focal consolidation or airspace disease. No blunting of costophrenic angles. No pneumothorax. Normal heart size and pulmonary vascularity. Degenerative changes in the spine and shoulders. IMPRESSION: Elevation of left hemidiaphragm with slight linear atelectasis in the left  lung base. Electronically Signed   By: Burman Nieves M.D.   On: 04/02/2016 21:20   US Renal  04/03/2016  CLINICAL DATA:  Acute kidney injury. EXAM: RENAL / URINARY TRACT  ULTRASOUND COMPLETE COMPARISON:  None. FINDINGS: Right Kidney: Length: 10.0 cm. Echogenicity within normal limits. No mass or hydronephrosis visualized. Left Kidney: Length: 9.8 cm. Echogenicity within normal limits. No mass or hydronephrosis visualized. Bladder: Appears normal for degree of bladder distention ; the bladder is nondistended due to the presence of a urinary bladder catheter. IMPRESSION: 1. No hydronephrosis or specific sonographic abnormality of the kidneys or urinary bladder identified. Electronically Signed   By: Gaylyn Rong M.D.   On: 04/03/2016 14:41   Dg Hand Complete Right  04/02/2016  CLINICAL DATA:  Patient with metacarpal pain after hitting hand on the porch railing. Initial encounter. EXAM: RIGHT HAND - COMPLETE 3+ VIEW COMPARISON:  Hand radiograph 07/16/2013 FINDINGS: Normal anatomic alignment. No evidence for acute fracture or dislocation. Soft tissue swelling about the dorsum of the hand. IMPRESSION: No acute osseous abnormality. Electronically Signed   By: Annia Belt M.D.   On: 04/02/2016 21:22    EKG:   Orders placed or performed during the hospital encounter of 04/02/16  . EKG 12-Lead  . EKG 12-Lead    ASSESSMENT AND PLAN:   60 year old female patient with history of osteoarthritis, GERD, back pain presented to the emergency room with generalized weakness and transient disorientation.  # Acute kidney injury probably from dehydration from poor by mouth intake and taking meloxicam for back pain (recently prescribed) continue IV fluids and monitor renal function closely. Avoid nephrotoxins  normal renal ultrasound and Foley catheter placement for monitoring output   nephrology consult appreciated Creat 3.69--3.89--3.16  #Right hand swelling/cellulitis with MSSA sepsis  -etiology unclear  -Hand x-ray with no acute findings -No evidence of abscess per Dr.Menz -IV cefazolin per ID  #. Urinary tract infection -UC mixed bacterial contamination  #.  Leukocytosis which could be secondary to sepsis 27K--25k--22.2K  # Osteoarthritis Pain medications as needed   #. GERD-PPI   #DVT prophylaxis with subcutaneous heparin  All the records are reviewed and case discussed with Care Management/Social Workerr. Management plans discussed with the patient, and sister and they are in agreement.  CODE STATUS: full code  TOTAL TIME TAKING CARE OF THIS PATIENT: 25 minutes.   POSSIBLE D/C IN 2-3 DAYS, DEPENDING ON CLINICAL CONDITION.   Jahleel Stroschein M.D on 04/04/2016 at 4:49 PM  Between 7am to 6pm - Pager - [phone] After 6pm go to [email or web address] - password [email or web address] [facility]  [facility]  Office  [phone]  CC: Primary care physician; No primary care provider on file.

## 2016-04-04 NOTE — Progress Notes (Signed)
[name] placed IJ in left side of pts neck.

## 2016-04-04 NOTE — Progress Notes (Signed)
Patient ID: Danielle PatchesRebecca A [name], female   DOB: 08/20/1956, 60 y.o.   MRN: [id number] Called by RN for pt being hard stick and difficult to get IV access. Tried by several nurses. Given her MSSA bacterimia cannot get picc or CL. Will get external IJ by [facility] if unable to get IV by [facility] RN

## 2016-04-04 NOTE — Progress Notes (Signed)
Right hand edema worse, no fluctuance. Cellulitis, right hand, somewhat worse. Will continue to follow.

## 2016-04-04 NOTE — NC FL2 (Signed)
[place] MEDICAID FL2 LEVEL OF CARE SCREENING TOOL     IDENTIFICATION  Patient Name: Danielle [name] Birthdate: 01-21-1956 Sex: female Admission Date (Current Location): 04/02/2016  [facility] and [place] Number:  Chiropodist and Address:  [facility], [address] [place], [place] [address]      Provider Number: [id number]  Attending Physician Name and Address:  Enedina Finner, MD  Relative Name and Phone Number:       Current Level of Care: Hospital Recommended Level of Care: Skilled Nursing Facility Prior Approval Number:    Date Approved/Denied:   PASRR Number:    Discharge Plan: SNF    Current Diagnoses: Patient Active Problem List   Diagnosis Date Noted  . Acute renal failure (HCC) 04/03/2016  . Dehydration 04/02/2016  . Acute renal failure (ARF) (HCC) 04/02/2016    Orientation RESPIRATION BLADDER Height & Weight     Self, Time, Situation, Place  Normal Continent Weight: 204 lb 1.6 oz (92.579 kg) Height:   (154.9 cm)  BEHAVIORAL SYMPTOMS/MOOD NEUROLOGICAL BOWEL NUTRITION STATUS   (none)  (none) Continent Diet (regular)  AMBULATORY STATUS COMMUNICATION OF NEEDS Skin   Extensive Assist Verbally Normal                       Personal Care Assistance Level of Assistance  Bathing, Dressing Bathing Assistance: Limited assistance   Dressing Assistance: Limited assistance     Functional Limitations Info   (none)          SPECIAL CARE FACTORS FREQUENCY  PT (By licensed PT)                    Contractures Contractures Info: Not present    Additional Factors Info  Code Status, Allergies Code Status Info: Full Allergies Info: compazine           Current Medications (04/04/2016):  This is the current hospital active medication list Current Facility-Administered Medications  Medication Dose Route Frequency Provider Last Rate Last Dose  . 0.9 %  sodium chloride infusion   Intravenous Continuous  Lamont Dowdy, MD 50 mL/hr at 04/04/16 0137    . acetaminophen (TYLENOL) tablet 650 mg  650 mg Oral Q6H PRN Ihor Austin, MD   650 mg at 04/04/16 1218   Or  . acetaminophen (TYLENOL) suppository 650 mg  650 mg Rectal Q6H PRN Pavan Pyreddy, MD      . albuterol (PROVENTIL) (2.5 MG/3ML) 0.083% nebulizer solution 3 mL  3 mL Inhalation Q6H PRN Ihor Austin, MD   3 mL at 04/04/16 0237  . ALPRAZolam (XANAX) tablet 0.5 mg  0.5 mg Oral QHS PRN Ihor Austin, MD      . ceFAZolin (ANCEF) IVPB 2g/100 mL premix  2 g Intravenous Q12H Ramonita Lab, MD   2 g at 04/04/16 0355  . diphenhydrAMINE (BENADRYL) capsule 25 mg  25 mg Oral Q8H PRN Ramonita Lab, MD      . guaiFENesin (MUCINEX) 12 hr tablet 600 mg  600 mg Oral BID Ihor Austin, MD   600 mg at 04/04/16 0654  . heparin injection 5,000 Units  5,000 Units Subcutaneous 3 times per day Ihor Austin, MD   5,000 Units at 04/04/16 1329  . ondansetron (ZOFRAN) tablet 4 mg  4 mg Oral Q6H PRN Ihor Austin, MD       Or  . ondansetron (ZOFRAN) injection 4 mg  4 mg Intravenous Q6H PRN Ihor Austin, MD      .  pantoprazole (PROTONIX) EC tablet 40 mg  40 mg Oral QAC breakfast Ihor AustinPavan Pyreddy, MD   40 mg at 04/04/16 0817  . senna-docusate (Senokot-S) tablet 1 tablet  1 tablet Oral BID Ihor AustinPavan Pyreddy, MD   1 tablet at 04/04/16 0530  . sodium chloride 0.9 % bolus 500 mL  500 mL Intravenous Once Jeanmarie PlantJames A McShane, MD   Stopped at 04/03/16 0055  . tiZANidine (ZANAFLEX) tablet 4 mg  4 mg Oral QHS Ihor AustinPavan Pyreddy, MD   4 mg at 04/03/16 2149  . venlafaxine XR (EFFEXOR-XR) 24 hr capsule 37.5 mg  37.5 mg Oral Q breakfast Ihor AustinPavan Pyreddy, MD   37.5 mg at 04/04/16 [id number]     Discharge Medications: Please see discharge summary for a list of discharge medications.  Relevant Imaging Results:  Relevant Lab Results:   Additional Information SS: [id number]  York SpanielMonica Kartik Fernando, LCSW

## 2016-04-04 NOTE — Progress Notes (Addendum)
[facility] request cleared to place IJ by Dr. Wynelle LinkKolluru. Even though pt has line in Right AC that does flush no blood return and pt will need IV antibiotics for extended time. Okay to continue with placing external IJ per Dr. Wynelle LinkKolluru also discontinue order for fluids as he believes patient may be getting fluid overloaded.

## 2016-04-04 NOTE — Consult Note (Signed)
Danielle [name]     Reason for Consult: Bacteremia    Referring Physician: gouru, A Date of Admission:  04/02/2016   Principal Problem:   Dehydration Active Problems:   Acute renal failure (ARF) ([place])   Acute renal failure (HCC)   HPI: Danielle [name] is a 60 y.o. female admitted 4/16 with sleepiness and weakness, after starting pain meds for back pain.  She was found on admission to have elevated Cr 3.89 at and elevated wbc 27k. UA revealed TNTC WBC, UCX mixed. BCX are now + MSSA (biofire result).  She was also noted to have swelling and pain over R hand and has been seen by Dr Rudene Christians She has been started on zosyn but now on ancef.     Of note she was seen by PCP and at [facility] ED for LBP- had MRI 4/12 negative for osteomyelitis   Past Medical History  Diagnosis Date  . GERD (gastroesophageal reflux [name])   . Osteoarthritis    Past Surgical History  Procedure Laterality Date  . Breast excisional biopsy Left     1981 cyst removed  . Ovarian cyst surgery     Social History  Substance Use Topics  . Smoking status: Never Smoker   . Smokeless tobacco: None  . Alcohol Use: No   Family History  Problem Relation Age of Onset  . Breast cancer Neg Hx     Allergies:  Allergies  Allergen Reactions  . Compazine [Prochlorperazine Edisylate] Nausea And Vomiting    Current antibiotics: Antibiotics Given (last 72 hours)    Date/Time Action Medication Dose Rate   04/03/16 0227 Given   cefTRIAXone (ROCEPHIN) 1 g in dextrose 5 % 50 mL IVPB 1 g 100 mL/hr   04/03/16 1606 Given   ceFAZolin (ANCEF) IVPB 2g/100 mL premix 2 g 200 mL/hr   04/04/16 0355 Given   ceFAZolin (ANCEF) IVPB 2g/100 mL premix 2 g 200 mL/hr      MEDICATIONS: .  ceFAZolin (ANCEF) IV  2 g Intravenous Q12H  . guaiFENesin  600 mg Oral BID  . heparin  5,000 Units Subcutaneous 3 times per day  . pantoprazole  40 mg Oral QAC breakfast  . senna-docusate  1 tablet Oral BID  . sodium chloride  500 mL  Intravenous Once  . tiZANidine  4 mg Oral QHS  . venlafaxine XR  37.5 mg Oral Q breakfast    Review of Systems - 11 systems reviewed and negative per HPI   OBJECTIVE: Temp:  [97.7 F (36.5 C)-98.7 F (37.1 C)] 98 F (36.7 C) (04/18 0546) Pulse Rate:  [80-103] 80 (04/18 0546) Resp:  [16-20] 16 (04/18 0546) BP: (94-149)/(57-71) 94/57 mmHg (04/18 0546) SpO2:  [93 %-96 %] 96 % (04/18 0546) Physical Exam  Constitutional:  oriented to person, place, and time. appears well-developed and well-nourished. No distress.  HENT: /AT, PERRLA, no scleral icterus Mouth/Throat: Oropharynx is clear and moist. No oropharyngeal exudate.  Cardiovascular: Normal rate, regular rhythm 2/6 sm  Pulmonary/Chest: bil rhonchi and wheeze Neck = supple, no nuchal rigidity Abdominal: Soft. Bowel sounds are normal.  exhibits no distension. There is no tenderness.  Lymphadenopathy: no cervical adenopathy. No axillary adenopathy Neurological: alert and oriented to person, place, and time.  Skin: R dorsum of hand with marked swelling, tendernss, warmth.  Psychiatric: a normal mood and affect.  behavior is normal.   LABS: Results for orders placed or performed during the hospital encounter of 04/02/16 (from the past 48 hour(s))  Comprehensive metabolic panel     Status: Abnormal   Collection Time: 04/02/16  7:16 PM  Result Value Ref Range   Sodium 134 (L) 135 - 145 mmol/L   Potassium 3.9 3.5 - 5.1 mmol/L   Chloride 102 101 - 111 mmol/L   CO2 18 (L) 22 - 32 mmol/L   Glucose, Bld 120 (H) 65 - 99 mg/dL   BUN 69 (H) 6 - 20 mg/dL   Creatinine, Ser 3.66 (H) 0.44 - 1.00 mg/dL   Calcium 8.2 (L) 8.9 - 10.3 mg/dL   Total Protein 7.2 6.5 - 8.1 g/dL   Albumin 3.2 (L) 3.5 - 5.0 g/dL   AST 27 15 - 41 U/L   ALT 40 14 - 54 U/L   Alkaline Phosphatase 164 (H) 38 - 126 U/L   Total Bilirubin 0.2 (L) 0.3 - 1.2 mg/dL   GFR calc non Af Amer 12 (L) >60 mL/min   GFR calc Af Amer 14 (L) >60 mL/min    Comment: (NOTE) The eGFR  has been calculated using the CKD EPI equation. This calculation has not been validated in all clinical situations. eGFR's persistently <60 mL/min signify possible Chronic Kidney [name].    Anion gap 14 5 - 15  CBC with Differential     Status: Abnormal   Collection Time: 04/02/16  7:16 PM  Result Value Ref Range   WBC 27.6 (H) 3.6 - 11.0 K/uL   RBC 3.67 (L) 3.80 - 5.20 MIL/uL   Hemoglobin 10.5 (L) 12.0 - 16.0 g/dL   HCT 32.0 (L) 35.0 - 47.0 %   MCV 87.2 80.0 - 100.0 fL   MCH 28.8 26.0 - 34.0 pg   MCHC 33.0 32.0 - 36.0 g/dL   RDW 15.3 (H) 11.5 - 14.5 %   Platelets 234 150 - 440 K/uL   Neutrophils Relative % 94 %   Neutro Abs 25.8 (H) 1.4 - 6.5 K/uL   Lymphocytes Relative 2 %   Lymphs Abs 0.5 (L) 1.0 - 3.6 K/uL   Monocytes Relative 4 %   Monocytes Absolute 1.2 (H) 0.2 - 0.9 K/uL   Eosinophils Relative 0 %   Eosinophils Absolute 0.0 0 - 0.7 K/uL   Basophils Relative 0 %   Basophils Absolute 0.0 0 - 0.1 K/uL  Urinalysis complete, with microscopic     Status: Abnormal   Collection Time: 04/02/16  7:16 PM  Result Value Ref Range   Color, Urine AMBER (A) YELLOW   APPearance CLOUDY (A) CLEAR   Glucose, UA 50 (A) NEGATIVE mg/dL   Bilirubin Urine NEGATIVE NEGATIVE   Ketones, ur NEGATIVE NEGATIVE mg/dL   Specific Gravity, Urine 1.016 1.005 - 1.030   Hgb urine dipstick 2+ (A) NEGATIVE   pH 5.0 5.0 - 8.0   Protein, ur 100 (A) NEGATIVE mg/dL   Nitrite NEGATIVE NEGATIVE   Leukocytes, UA 1+ (A) NEGATIVE   RBC / HPF 0-5 0 - 5 RBC/hpf   WBC, UA TOO NUMEROUS TO COUNT 0 - 5 WBC/hpf   Bacteria, UA RARE (A) NONE SEEN   Squamous Epithelial / LPF 6-30 (A) NONE SEEN   Mucous PRESENT    Hyaline Casts, UA PRESENT    Amorphous Crystal PRESENT   Urine Drug Screen, Qualitative     Status: Abnormal   Collection Time: 04/02/16  7:16 PM  Result Value Ref Range   Tricyclic, Ur Screen NONE DETECTED NONE DETECTED   Amphetamines, Ur Screen NONE DETECTED NONE DETECTED   MDMA (Ecstasy)Ur Screen NONE  DETECTED NONE DETECTED   Cocaine Metabolite,Ur [place] NONE DETECTED NONE DETECTED   Opiate, Ur Screen POSITIVE (A) NONE DETECTED   Phencyclidine (PCP) Ur S NONE DETECTED NONE DETECTED   Cannabinoid 50 Ng, Ur [place] NONE DETECTED NONE DETECTED   Barbiturates, Ur Screen NONE DETECTED NONE DETECTED   Benzodiazepine, Ur Scrn NONE DETECTED NONE DETECTED   Methadone Scn, Ur NONE DETECTED NONE DETECTED    Comment: (NOTE) 947  Tricyclics, urine               Cutoff 1000 ng/mL 200  Amphetamines, urine             Cutoff 1000 ng/mL 300  MDMA (Ecstasy), urine           Cutoff 500 ng/mL 400  Cocaine Metabolite, urine       Cutoff 300 ng/mL 500  Opiate, urine                   Cutoff 300 ng/mL 600  Phencyclidine (PCP), urine      Cutoff 25 ng/mL 700  Cannabinoid, urine              Cutoff 50 ng/mL 800  Barbiturates, urine             Cutoff 200 ng/mL 900  Benzodiazepine, urine           Cutoff 200 ng/mL 1000 Methadone, urine                Cutoff 300 ng/mL 1100 1200 The urine drug screen provides only a preliminary, unconfirmed 1300 analytical test result and should not be used for non-medical 1400 purposes. Clinical consideration and professional judgment should 1500 be applied to any positive drug screen result due to possible 1600 interfering substances. A more specific alternate chemical method 1700 must be used in order to obtain a confirmed analytical result.  1800 Gas chromato graphy / mass spectrometry (GC/MS) is the preferred 1900 confirmatory method.   Troponin I     Status: Abnormal   Collection Time: 04/02/16  7:16 PM  Result Value Ref Range   Troponin I 0.06 (H) <0.031 ng/mL    Comment: READ BACK AND VERIFIED WITH [place]  [facility] AT 2059 04/02/16 SDR        PERSISTENTLY INCREASED TROPONIN VALUES IN THE RANGE OF 0.04-0.49 ng/mL CAN BE SEEN IN:       -UNSTABLE ANGINA       -CONGESTIVE HEART FAILURE       -MYOCARDITIS       -CHEST TRAUMA       -ARRYHTHMIAS       -LATE PRESENTING  MYOCARDIAL INFARCTION       -COPD   CLINICAL FOLLOW-UP RECOMMENDED.   Brain natriuretic peptide     Status: Abnormal   Collection Time: 04/02/16  7:16 PM  Result Value Ref Range   B Natriuretic Peptide 506.0 (H) 0.0 - 100.0 pg/mL  Culture, blood (routine x 2)     Status: Abnormal (Preliminary result)   Collection Time: 04/02/16  7:16 PM  Result Value Ref Range   Specimen Description BLOOD LEFT ASSIST CONTROL    Special Requests BOTTLES DRAWN AEROBIC AND ANAEROBIC [place]    Culture (A)     STAPHYLOCOCCUS AUREUS IN BOTH AEROBIC AND ANAEROBIC BOTTLES SUSCEPTIBILITIES TO FOLLOW    Report Status PENDING   Culture, blood (routine x 2)     Status: Abnormal (Preliminary result)   Collection Time: 04/02/16  7:16 PM  Result Value Ref Range   Specimen Description BLOOD RIGHT HAND    Special Requests BOTTLES DRAWN AEROBIC AND ANAEROBIC [id number]    Culture  Setup Time      GRAM POSITIVE COCCI IN BOTH AEROBIC AND ANAEROBIC BOTTLES CRITICAL RESULT CALLED TO, READ BACK BY AND VERIFIED WITH: KAREN HAYES AT 8099 ON 04/03/16 BY CTJ.    Culture (A)     STAPHYLOCOCCUS AUREUS IN BOTH AEROBIC AND ANAEROBIC BOTTLES SUSCEPTIBILITIES TO FOLLOW    Report Status PENDING   Urine culture     Status: None   Collection Time: 04/02/16  7:16 PM  Result Value Ref Range   Specimen Description URINE, RANDOM    Special Requests NONE    Culture MULTIPLE SPECIES PRESENT, SUGGEST RECOLLECTION    Report Status 04/04/2016 FINAL   Lactic acid, plasma     Status: None   Collection Time: 04/02/16  7:16 PM  Result Value Ref Range   Lactic Acid, Venous 1.2 0.5 - 2.0 mmol/L  Blood Culture ID Panel (Reflexed)     Status: Abnormal   Collection Time: 04/02/16  7:16 PM  Result Value Ref Range   Enterococcus species NOT DETECTED NOT DETECTED   Vancomycin resistance NOT DETECTED NOT DETECTED   Listeria monocytogenes NOT DETECTED NOT DETECTED   Staphylococcus species DETECTED (A) NOT DETECTED    Comment:  CRITICAL RESULT CALLED TO, READ BACK BY AND VERIFIED WITH: KAREN HAYES FOR STAPHYLOCOCCUS SPECIES AND STAPHYLOCOCCUS AUREUS AT 1338 ON 04/03/16 BY CTJ.    Staphylococcus aureus DETECTED (A) NOT DETECTED   Methicillin resistance NOT DETECTED NOT DETECTED   Streptococcus species NOT DETECTED NOT DETECTED   Streptococcus agalactiae NOT DETECTED NOT DETECTED   Streptococcus pneumoniae NOT DETECTED NOT DETECTED   Streptococcus pyogenes NOT DETECTED NOT DETECTED   Acinetobacter baumannii NOT DETECTED NOT DETECTED   Enterobacteriaceae species NOT DETECTED NOT DETECTED   Enterobacter cloacae complex NOT DETECTED NOT DETECTED   Escherichia coli NOT DETECTED NOT DETECTED   Klebsiella oxytoca NOT DETECTED NOT DETECTED   Klebsiella pneumoniae NOT DETECTED NOT DETECTED   Proteus species NOT DETECTED NOT DETECTED   Serratia marcescens NOT DETECTED NOT DETECTED   Carbapenem resistance NOT DETECTED NOT DETECTED   Haemophilus influenzae NOT DETECTED NOT DETECTED   Neisseria meningitidis NOT DETECTED NOT DETECTED   Pseudomonas aeruginosa NOT DETECTED NOT DETECTED   Candida albicans NOT DETECTED NOT DETECTED   Candida glabrata NOT DETECTED NOT DETECTED   Candida krusei NOT DETECTED NOT DETECTED   Candida parapsilosis NOT DETECTED NOT DETECTED   Candida tropicalis NOT DETECTED NOT DETECTED  Lactic acid, plasma     Status: None   Collection Time: 04/03/16  1:58 AM  Result Value Ref Range   Lactic Acid, Venous 1.1 0.5 - 2.0 mmol/L  Basic metabolic panel     Status: Abnormal   Collection Time: 04/03/16  1:58 AM  Result Value Ref Range   Sodium 135 135 - 145 mmol/L   Potassium 3.3 (L) 3.5 - 5.1 mmol/L   Chloride 103 101 - 111 mmol/L   CO2 20 (L) 22 - 32 mmol/L   Glucose, Bld 109 (H) 65 - 99 mg/dL   BUN 70 (H) 6 - 20 mg/dL   Creatinine, Ser 3.89 (H) 0.44 - 1.00 mg/dL   Calcium 7.9 (L) 8.9 - 10.3 mg/dL   GFR calc non Af Amer 12 (L) >60 mL/min   GFR calc Af Amer 13 (L) >60 mL/min    Comment:  (NOTE) The  eGFR has been calculated using the CKD EPI equation. This calculation has not been validated in all clinical situations. eGFR's persistently <60 mL/min signify possible Chronic Kidney [name].    Anion gap 12 5 - 15  CBC     Status: Abnormal   Collection Time: 04/03/16  1:58 AM  Result Value Ref Range   WBC 25.0 (H) 3.6 - 11.0 K/uL   RBC 3.12 (L) 3.80 - 5.20 MIL/uL   Hemoglobin 9.2 (L) 12.0 - 16.0 g/dL   HCT 27.6 (L) 35.0 - 47.0 %   MCV 88.3 80.0 - 100.0 fL   MCH 29.4 26.0 - 34.0 pg   MCHC 33.2 32.0 - 36.0 g/dL   RDW 15.3 (H) 11.5 - 14.5 %   Platelets 214 150 - 440 K/uL    Comment: COUNT MAY BE INACCURATE DUE TO FIBRIN CLUMPS.  Renal function panel     Status: Abnormal   Collection Time: 04/04/16  5:09 AM  Result Value Ref Range   Sodium 134 (L) 135 - 145 mmol/L   Potassium 4.0 3.5 - 5.1 mmol/L   Chloride 108 101 - 111 mmol/L   CO2 16 (L) 22 - 32 mmol/L   Glucose, Bld 87 65 - 99 mg/dL   BUN 69 (H) 6 - 20 mg/dL   Creatinine, Ser 3.16 (H) 0.44 - 1.00 mg/dL   Calcium 8.3 (L) 8.9 - 10.3 mg/dL   Phosphorus 5.6 (H) 2.5 - 4.6 mg/dL   Albumin 2.5 (L) 3.5 - 5.0 g/dL   GFR calc non Af Amer 15 (L) >60 mL/min   GFR calc Af Amer 17 (L) >60 mL/min    Comment: (NOTE) The eGFR has been calculated using the CKD EPI equation. This calculation has not been validated in all clinical situations. eGFR's persistently <60 mL/min signify possible Chronic Kidney [name].    Anion gap 10 5 - 15  CBC     Status: Abnormal   Collection Time: 04/04/16  5:09 AM  Result Value Ref Range   WBC 22.2 (H) 3.6 - 11.0 K/uL   RBC 3.42 (L) 3.80 - 5.20 MIL/uL   Hemoglobin 9.8 (L) 12.0 - 16.0 g/dL   HCT 30.5 (L) 35.0 - 47.0 %   MCV 89.3 80.0 - 100.0 fL   MCH 28.8 26.0 - 34.0 pg   MCHC 32.2 32.0 - 36.0 g/dL   RDW 15.6 (H) 11.5 - 14.5 %   Platelets 252 150 - 440 K/uL   No components found for: ESR, C REACTIVE PROTEIN MICRO: Recent Results (from the past 720 hour(s))  Culture, blood (routine x  2)     Status: Abnormal (Preliminary result)   Collection Time: 04/02/16  7:16 PM  Result Value Ref Range Status   Specimen Description BLOOD LEFT ASSIST CONTROL  Final   Special Requests BOTTLES DRAWN AEROBIC AND ANAEROBIC [place]  Final   Culture (A)  Final    STAPHYLOCOCCUS AUREUS IN BOTH AEROBIC AND ANAEROBIC BOTTLES SUSCEPTIBILITIES TO FOLLOW    Report Status PENDING  Incomplete  Culture, blood (routine x 2)     Status: Abnormal (Preliminary result)   Collection Time: 04/02/16  7:16 PM  Result Value Ref Range Status   Specimen Description BLOOD RIGHT HAND  Final   Special Requests BOTTLES DRAWN AEROBIC AND ANAEROBIC [place]  Final   Culture  Setup Time   Final    GRAM POSITIVE COCCI IN BOTH AEROBIC AND ANAEROBIC BOTTLES CRITICAL RESULT CALLED TO, READ BACK BY AND VERIFIED WITH: KAREN HAYES  AT 1338 ON 04/03/16 BY CTJ.    Culture (A)  Final    STAPHYLOCOCCUS AUREUS IN BOTH AEROBIC AND ANAEROBIC BOTTLES SUSCEPTIBILITIES TO FOLLOW    Report Status PENDING  Incomplete  Urine culture     Status: None   Collection Time: 04/02/16  7:16 PM  Result Value Ref Range Status   Specimen Description URINE, RANDOM  Final   Special Requests NONE  Final   Culture MULTIPLE SPECIES PRESENT, SUGGEST RECOLLECTION  Final   Report Status 04/04/2016 FINAL  Final  Blood Culture ID Panel (Reflexed)     Status: Abnormal   Collection Time: 04/02/16  7:16 PM  Result Value Ref Range Status   Enterococcus species NOT DETECTED NOT DETECTED Final   Vancomycin resistance NOT DETECTED NOT DETECTED Final   Listeria monocytogenes NOT DETECTED NOT DETECTED Final   Staphylococcus species DETECTED (A) NOT DETECTED Final    Comment: CRITICAL RESULT CALLED TO, READ BACK BY AND VERIFIED WITH: KAREN HAYES FOR STAPHYLOCOCCUS SPECIES AND STAPHYLOCOCCUS AUREUS AT 1338 ON 04/03/16 BY CTJ.    Staphylococcus aureus DETECTED (A) NOT DETECTED Final   Methicillin resistance NOT DETECTED NOT DETECTED Final    Streptococcus species NOT DETECTED NOT DETECTED Final   Streptococcus agalactiae NOT DETECTED NOT DETECTED Final   Streptococcus pneumoniae NOT DETECTED NOT DETECTED Final   Streptococcus pyogenes NOT DETECTED NOT DETECTED Final   Acinetobacter baumannii NOT DETECTED NOT DETECTED Final   Enterobacteriaceae species NOT DETECTED NOT DETECTED Final   Enterobacter cloacae complex NOT DETECTED NOT DETECTED Final   Escherichia coli NOT DETECTED NOT DETECTED Final   Klebsiella oxytoca NOT DETECTED NOT DETECTED Final   Klebsiella pneumoniae NOT DETECTED NOT DETECTED Final   Proteus species NOT DETECTED NOT DETECTED Final   Serratia marcescens NOT DETECTED NOT DETECTED Final   Carbapenem resistance NOT DETECTED NOT DETECTED Final   Haemophilus influenzae NOT DETECTED NOT DETECTED Final   Neisseria meningitidis NOT DETECTED NOT DETECTED Final   Pseudomonas aeruginosa NOT DETECTED NOT DETECTED Final   Candida albicans NOT DETECTED NOT DETECTED Final   Candida glabrata NOT DETECTED NOT DETECTED Final   Candida krusei NOT DETECTED NOT DETECTED Final   Candida parapsilosis NOT DETECTED NOT DETECTED Final   Candida tropicalis NOT DETECTED NOT DETECTED Final    IMAGING: Dg Chest 2 View  04/02/2016  CLINICAL DATA:  Altered mental status.  Wheezing. EXAM: CHEST  2 VIEW COMPARISON:  06/04/2013 FINDINGS: Slight increase of left hemidiaphragm with linear atelectasis in the left lung base. No focal consolidation or airspace [name]. No blunting of costophrenic angles. No pneumothorax. Normal heart size and pulmonary vascularity. Degenerative changes in the spine and shoulders. IMPRESSION: Elevation of left hemidiaphragm with slight linear atelectasis in the left lung base. Electronically Signed   By: Lucienne Capers M.D.   On: 04/02/2016 21:20   US Renal  04/03/2016  CLINICAL DATA:  Acute kidney injury. EXAM: RENAL / URINARY TRACT ULTRASOUND COMPLETE COMPARISON:  None. FINDINGS: Right Kidney: Length: 10.0 cm.  Echogenicity within normal limits. No mass or hydronephrosis visualized. Left Kidney: Length: 9.8 cm. Echogenicity within normal limits. No mass or hydronephrosis visualized. Bladder: Appears normal for degree of bladder distention ; the bladder is nondistended due to the presence of a urinary bladder catheter. IMPRESSION: 1. No hydronephrosis or specific sonographic abnormality of the kidneys or urinary bladder identified. Electronically Signed   By: Van Clines M.D.   On: 04/03/2016 14:41   Dg Hand Complete Right  04/02/2016  CLINICAL  DATA:  Patient with metacarpal pain after hitting hand on the porch railing. Initial encounter. EXAM: RIGHT HAND - COMPLETE 3+ VIEW COMPARISON:  Hand radiograph 07/16/2013 FINDINGS: Normal anatomic alignment. No evidence for acute fracture or dislocation. Soft tissue swelling about the dorsum of the hand. IMPRESSION: No acute osseous abnormality. Electronically Signed   By: Lovey Newcomer M.D.   On: 04/02/2016 21:22   03/29/16 EXAM: Magnetic resonance imaging, spinal canal and contents, lumbar, without contrast material. DATE: 03/29/16 15:44:14 ACCESSION: [id number] UN DICTATED: 03/29/16 16:07:39 INTERPRETATION LOCATION: [place]  CLINICAL INDICATION: 60 Year Old (F): Low back pain for 1 month, unable to walk, right leg weakness.  COMPARISON: None.  TECHNIQUE: Multiplanar MRI was performed through the lumbar spine without intravenous contrast.  FINDINGS: For the purposes of this dictation, the lowest well formed intervertebral disc space is assumed to be the L5-S1 level, and there are presumed to be five lumbar-type vertebral bodies.  There is mild grade 1 anterolisthesis of L4 on L5. The remainder of the vertebral bodies are normally aligned. The vertebral body heights are well preserved.   There is mild disc height loss at L4-5 with associated disc desiccation. The remainder of the disc spaces are relatively well-maintained.   At L3-4, there is  segmental thickening, and mild canal stenosis. The neural foramina are patent.  At L4-5, there is uncovering of the intervertebral disc, secondary to grade 1 anterolisthesis. Bilateral facet arthropathy. No significant central canal stenosis. Neural foramina are patent.  The sacral ala are diffusely decreased in T1 signal intensity with associated increased T2/STIR signal, most compatible with sacral insufficiency fractures.  The signal intensity from the remainder of the vertebral body bone marrow and spinal cord is normal.   Mild multilevel facet hypertrophy and thickening of the ligamentum flavum throughout the lumbar spine. However, there is no significant spinal canal or neural foraminal stenosis. The conus medullaris ends at a normal level.  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 -- No significant canal stenosis or neuroforaminal narrowing at any level within the lumbar spine. -- Mild grade 1 anterolisthesis of L4 on L5.   Assessment:   KARYME MCCONATHY is a 61 y.o. female with MSSA bacteremia, R wrist swelling and cellulitis (possible septic arthritis) and LBP (concern for discitis but MRI 4/12 at [facility] negative). Also some L shoulder pain   Recommendations Repeat bcx Check echo Check esr crp Elevate R arm Will porbably need TEE if TTE negatiave Will need minimum 2 weeks IV abx from first negative culture Thank you very much for allowing me to participate in the care of this patient. Please call with questions.   Cheral Marker. Ola Spurr, MD

## 2016-04-04 NOTE — Progress Notes (Signed)
Physical Therapy Treatment Patient Details Name: Danielle [name] MRN: [id number] DOB: 07-Mar-1956 Today's Date: 04/04/2016    History of Present Illness Danielle [name] is a 60 y.o. female with a known history of Osteoarthritis, GERD presented to the emergency room because of weakness and somnolence. Patient had been recently seeing the orthopedic doctor and also [facility] [place] for low back pain and she is on pain medication and also completed her dose of oral steroids. Her last day of oral steroids for this today. According to the family members patient has been more lethargic and somnolent after being on pain medication. Patient is awake and alert and responds to all verbal commands in the emergency room today. Has history of dysuria. No history of any chest pain or shortness of breath. Has generalized weakness. Upon evaluation the emergency room she appeared to be dehydrated and also her creatinine was elevated. Patient was started on IV fluids and the received a IV antibiotic in the emergency room for possible urinary tract infection.No complaints of cough. No history or recent travel or sick contacts at home. Pt reports 1 fall in the last 12 month.    PT Comments    Treatment attempted initially; pt with nursing staff. Second attempt, pt agreeable to PT. Pt complains of severe pain in right upper extremity (hand/forearm) and a little less so in R hip. All mobility difficult for pt at this time requiring Min to Mod A and significant increased time. Pt also noted to sound congested in chest with wheezing and shortness of breath; O2 saturation 95-96% throughout session. Pt unable to use rolling walker from bed to chair due to touch me not pain in R hand; pt tolerated hand held assist of 1 at left upper extremity. Pt has a very difficulty time lifting and advancing right lower extremity. Pt does take 7-8 small effortful steps bed to chair; pt notes improved comfort in right hip with sitting upright in  chair and legs dependent. Continue PT to progress range and strength throughout to improve all functional mobility.   Follow Up Recommendations  SNF     Equipment Recommendations  Rolling walker with 5" wheels (may benefit from cane or hemiwalker at this time)    Recommendations for Other Services       Precautions / Restrictions Precautions Precautions: Fall Restrictions Weight Bearing Restrictions: No    Mobility  Bed Mobility Overal bed mobility: Needs Assistance Bed Mobility: Supine to Sit     Supine to sit: Mod assist     General bed mobility comments: Significant increased time to get B legs to edge of bed. Mod A for trunk  Transfers Overall transfer level: Needs assistance Equipment used: 1 person hand held assist Transfers: Sit to/from Stand Sit to Stand: Min assist         General transfer comment: Pt unable to use RUE well due to pain/swelling. Stand with use of LUE and pushing back of legs off bed.   Ambulation/Gait Ambulation/Gait assistance: Min assist Ambulation Distance (Feet): 3 Feet Assistive device: 1 person hand held assist Gait Pattern/deviations: Step-to pattern;Decreased step length - right;Decreased step length - left;Decreased weight shift to right;Antalgic Gait velocity: slow Gait velocity interpretation: <1.8 ft/sec, indicative of risk for recurrent falls General Gait Details: Difficult to bear weight on R hip and lift RLE.   Stairs            Wheelchair Mobility    Modified Rankin (Stroke Patients Only)  Balance Overall balance assessment: Needs assistance Sitting-balance support: Feet supported Sitting balance-Leahy Scale: Good     Standing balance support: Single extremity supported Standing balance-Leahy Scale: Fair                      Cognition Arousal/Alertness: Awake/alert Behavior During Therapy: WFL for tasks assessed/performed Overall Cognitive Status: Within Functional Limits for tasks  assessed                      Exercises      General Comments        Pertinent Vitals/Pain Pain Assessment: 0-10 Pain Score: 7  Pain Location: R hand; 5/10 R hip Pain Intervention(s): Limited activity within patient's tolerance;Repositioned    Home Living                      Prior Function            PT Goals (current goals can now be found in the care plan section) Progress towards PT goals: Progressing toward goals    Frequency  Min 2X/week    PT Plan Current plan remains appropriate    Co-evaluation             End of Session   Activity Tolerance: Patient limited by fatigue;Patient limited by pain Patient left: in chair;with call bell/phone within reach;with chair alarm set;with family/visitor present     Time: 1138-1202 PT Time Calculation (min) (ACUTE ONLY): 24 min  Charges:  $Gait Training: 8-22 mins $Therapeutic Activity: 8-22 mins                    G Codes:      Danielle [name], PTA 04/04/2016, 1:20 PM

## 2016-04-04 NOTE — Progress Notes (Signed)
Per Dr. Sampson GoonFitzgerald okay to change lab for sedimentation rate to 5AM labs tomorrow. Will change order.

## 2016-04-04 NOTE — Progress Notes (Signed)
Notified Dr. Allena KatzPatel that pt appears to have redness in the left shoulder and chest. Multiple IV attempts have been made. If ICU nurse is unable to obtain IV access will need to place order for IJ to be [facility].

## 2016-04-05 LAB — CBC
HCT: 34.5 % — ABNORMAL LOW (ref 35.0–47.0)
HEMOGLOBIN: 11.5 g/dL — AB (ref 12.0–16.0)
MCH: 29.1 pg (ref 26.0–34.0)
MCHC: 33.3 g/dL (ref 32.0–36.0)
MCV: 87.4 fL (ref 80.0–100.0)
Platelets: 266 10*3/uL (ref 150–440)
RBC: 3.95 MIL/uL (ref 3.80–5.20)
RDW: 15.2 % — ABNORMAL HIGH (ref 11.5–14.5)
WBC: 25 10*3/uL — ABNORMAL HIGH (ref 3.6–11.0)

## 2016-04-05 LAB — PROTEIN ELECTROPHORESIS, SERUM
A/G RATIO SPE: 0.8 (ref 0.7–1.7)
A/G RATIO SPE: 0.7 (ref 0.7–1.7)
ALPHA-2-GLOBULIN: 1.1 g/dL — AB (ref 0.4–1.0)
ALPHA-2-GLOBULIN: 1.1 g/dL — AB (ref 0.4–1.0)
Albumin ELP: 2.2 g/dL — ABNORMAL LOW (ref 2.9–4.4)
Albumin ELP: 2.2 g/dL — ABNORMAL LOW (ref 2.9–4.4)
Alpha-1-Globulin: 0.3 g/dL (ref 0.0–0.4)
Alpha-1-Globulin: 0.3 g/dL (ref 0.0–0.4)
BETA GLOBULIN: 0.8 g/dL (ref 0.7–1.3)
Beta Globulin: 0.9 g/dL (ref 0.7–1.3)
GAMMA GLOBULIN: 0.7 g/dL (ref 0.4–1.8)
GLOBULIN, TOTAL: 3 g/dL (ref 2.2–3.9)
Gamma Globulin: 0.7 g/dL (ref 0.4–1.8)
Globulin, Total: 2.9 g/dL (ref 2.2–3.9)
TOTAL PROTEIN ELP: 5.2 g/dL — AB (ref 6.0–8.5)
Total Protein ELP: 5.1 g/dL — ABNORMAL LOW (ref 6.0–8.5)

## 2016-04-05 LAB — RENAL FUNCTION PANEL
Albumin: 2.4 g/dL — ABNORMAL LOW (ref 3.5–5.0)
Anion gap: 14 (ref 5–15)
BUN: 62 mg/dL — ABNORMAL HIGH (ref 6–20)
CHLORIDE: 105 mmol/L (ref 101–111)
CO2: 15 mmol/L — AB (ref 22–32)
CREATININE: 2.63 mg/dL — AB (ref 0.44–1.00)
Calcium: 8.4 mg/dL — ABNORMAL LOW (ref 8.9–10.3)
GFR calc non Af Amer: 18 mL/min — ABNORMAL LOW (ref >60)
GFR, EST AFRICAN AMERICAN: 21 mL/min — AB (ref >60)
Glucose, Bld: 66 mg/dL (ref 65–99)
Phosphorus: 4.9 mg/dL — ABNORMAL HIGH (ref 2.5–4.6)
Potassium: 4 mmol/L (ref 3.5–5.1)
Sodium: 134 mmol/L — ABNORMAL LOW (ref 135–145)

## 2016-04-05 LAB — CULTURE, BLOOD (ROUTINE X 2)

## 2016-04-05 LAB — MPO/PR-3 (ANCA) ANTIBODIES
ANCA Proteinase 3: <3.5 U/mL (ref 0.0–3.5)
ANCA Proteinase 3: <3.5 U/mL (ref 0.0–3.5)
Myeloperoxidase Abs: <9 U/mL (ref 0.0–9.0)

## 2016-04-05 LAB — HEPATITIS C ANTIBODY

## 2016-04-05 LAB — HEPATITIS B SURFACE ANTIBODY,QUALITATIVE: HEP B S AB: NONREACTIVE

## 2016-04-05 LAB — SEDIMENTATION RATE: SED RATE: 109 mm/h — AB (ref 0–30)

## 2016-04-05 LAB — HEPATITIS B CORE ANTIBODY, IGM: Hep B C IgM: NEGATIVE

## 2016-04-05 LAB — C-REACTIVE PROTEIN: CRP: 39.4 mg/dL — ABNORMAL HIGH (ref <1.0)

## 2016-04-05 LAB — GLOMERULAR BASEMENT MEMBRANE ANTIBODIES: GBM Ab: 3 U (ref 0–20)

## 2016-04-05 LAB — C3 COMPLEMENT: C3 COMPLEMENT: 171 mg/dL — AB (ref 82–167)

## 2016-04-05 LAB — HEPATITIS B SURFACE ANTIGEN: Hepatitis B Surface Ag: NEGATIVE

## 2016-04-05 LAB — C4 COMPLEMENT: Complement C4, Body Fluid: 36 mg/dL (ref 14–44)

## 2016-04-05 MED ORDER — SODIUM CHLORIDE 0.9 % IV SOLN: INTRAVENOUS | Status: DC

## 2016-04-05 MED ORDER — BISACODYL 5 MG PO TBEC
10.0000 mg | DELAYED_RELEASE_TABLET | Freq: Once | ORAL | Status: AC
  Administered 2016-04-05: 10 mg via ORAL
  Filled 2016-04-05: qty 2

## 2016-04-05 NOTE — Progress Notes (Signed)
Spoke to Dr. Allena KatzPatel okay to place order for CBC and BMP to be used with blood drawn from last night. Per Lab only have to place order for CBC as renal function will cover other labs.

## 2016-04-05 NOTE — Consult Note (Signed)
Pharmacy Antibiotic Note  Danielle [name] is a 60 y.o. female admitted on 04/02/2016 with bacteremia.  Pharmacy has been consulted for cefazolin dosing.  Plan: BCID results: MSSA  Continue Cefazolin 2g q 12 hours. Pt will need atleast 2 weeks of IV abx from neg blood culture per ID. Pt to have echo. Blood cx taken 4/18 ngtd  Height: 5\' 1"  (154.9 cm) Weight: 204 lb 1.6 oz (92.579 kg) IBW/kg (Calculated) : 47.8  Temp (24hrs), Avg:98.1 F (36.7 C), Min:98 F (36.7 C), Max:98.1 F (36.7 C)   Recent Labs Lab 04/02/16 1916 04/03/16 0158 04/04/16 0509 04/04/16 2206  WBC 27.6* 25.0* 22.2* 25.0*  CREATININE 3.66* 3.89* 3.16* 2.63*  LATICACIDVEN 1.2 1.1  --   --     Estimated Creatinine Clearance: 23.3 mL/min (by C-G formula based on Cr of 2.63).    Allergies  Allergen Reactions  . Compazine [Prochlorperazine Edisylate] Nausea And Vomiting    Antimicrobials this admission: ceftriaxone 4/16 >> 4/17 cefazolin 4/17 >>   Dose adjustments this admission:   Microbiology results: Recent Results (from the past 240 hour(s))  Culture, blood (routine x 2)     Status: Abnormal   Collection Time: 04/02/16  7:16 PM  Result Value Ref Range Status   Specimen Description BLOOD LEFT ASSIST CONTROL  Final   Special Requests BOTTLES DRAWN AEROBIC AND ANAEROBIC 1CCAERO,1CCANA  Final   Culture  Setup Time   Final    GRAM POSITIVE COCCI IN BOTH AEROBIC AND ANAEROBIC BOTTLES CRITICAL VALUE NOTED.  VALUE IS CONSISTENT WITH PREVIOUSLY REPORTED AND CALLED VALUE.    Culture (A)  Final    STAPHYLOCOCCUS AUREUS IN BOTH AEROBIC AND ANAEROBIC BOTTLES    Report Status 04/05/2016 FINAL  Final   Organism ID, Bacteria STAPHYLOCOCCUS AUREUS  Final      Susceptibility   Staphylococcus aureus - MIC*    CIPROFLOXACIN <=0.5 SENSITIVE Sensitive     ERYTHROMYCIN <=0.25 SENSITIVE Sensitive     GENTAMICIN <=0.5 SENSITIVE Sensitive     OXACILLIN 0.5 SENSITIVE Sensitive     TETRACYCLINE 2 SENSITIVE  Sensitive     VANCOMYCIN 1 SENSITIVE Sensitive     TRIMETH/SULFA <=10 SENSITIVE Sensitive     CLINDAMYCIN <=0.25 SENSITIVE Sensitive     RIFAMPIN <=0.5 SENSITIVE Sensitive     Inducible Clindamycin NEGATIVE Sensitive     * STAPHYLOCOCCUS AUREUS  Culture, blood (routine x 2)     Status: Abnormal   Collection Time: 04/02/16  7:16 PM  Result Value Ref Range Status   Specimen Description BLOOD RIGHT HAND  Final   Special Requests BOTTLES DRAWN AEROBIC AND ANAEROBIC 1CCAERO,1CCANA  Final   Culture  Setup Time   Final    GRAM POSITIVE COCCI IN BOTH AEROBIC AND ANAEROBIC BOTTLES CRITICAL RESULT CALLED TO, READ BACK BY AND VERIFIED WITH: KAREN HAYES AT 1338 ON 04/03/16 BY CTJ.    Culture (A)  Final    STAPHYLOCOCCUS AUREUS IN BOTH AEROBIC AND ANAEROBIC BOTTLES    Report Status 04/05/2016 FINAL  Final   Organism ID, Bacteria STAPHYLOCOCCUS AUREUS  Final      Susceptibility   Staphylococcus aureus - MIC*    CIPROFLOXACIN <=0.5 SENSITIVE Sensitive     ERYTHROMYCIN <=0.25 SENSITIVE Sensitive     GENTAMICIN <=0.5 SENSITIVE Sensitive     OXACILLIN 0.5 SENSITIVE Sensitive     TETRACYCLINE 2 SENSITIVE Sensitive     VANCOMYCIN 1 SENSITIVE Sensitive     TRIMETH/SULFA <=10 SENSITIVE Sensitive     CLINDAMYCIN <=  0.25 SENSITIVE Sensitive     RIFAMPIN <=0.5 SENSITIVE Sensitive     Inducible Clindamycin NEGATIVE Sensitive     * STAPHYLOCOCCUS AUREUS  Urine culture     Status: None   Collection Time: 04/02/16  7:16 PM  Result Value Ref Range Status   Specimen Description URINE, RANDOM  Final   Special Requests NONE  Final   Culture MULTIPLE SPECIES PRESENT, SUGGEST RECOLLECTION  Final   Report Status 04/04/2016 FINAL  Final  Blood Culture ID Panel (Reflexed)     Status: Abnormal   Collection Time: 04/02/16  7:16 PM  Result Value Ref Range Status   Enterococcus species NOT DETECTED NOT DETECTED Final   Vancomycin resistance NOT DETECTED NOT DETECTED Final   Listeria monocytogenes NOT DETECTED NOT  DETECTED Final   Staphylococcus species DETECTED (A) NOT DETECTED Final    Comment: CRITICAL RESULT CALLED TO, READ BACK BY AND VERIFIED WITH: KAREN HAYES FOR STAPHYLOCOCCUS SPECIES AND STAPHYLOCOCCUS AUREUS AT 1338 ON 04/03/16 BY CTJ.    Staphylococcus aureus DETECTED (A) NOT DETECTED Final   Methicillin resistance NOT DETECTED NOT DETECTED Final   Streptococcus species NOT DETECTED NOT DETECTED Final   Streptococcus agalactiae NOT DETECTED NOT DETECTED Final   Streptococcus pneumoniae NOT DETECTED NOT DETECTED Final   Streptococcus pyogenes NOT DETECTED NOT DETECTED Final   Acinetobacter baumannii NOT DETECTED NOT DETECTED Final   Enterobacteriaceae species NOT DETECTED NOT DETECTED Final   Enterobacter cloacae complex NOT DETECTED NOT DETECTED Final   Escherichia coli NOT DETECTED NOT DETECTED Final   Klebsiella oxytoca NOT DETECTED NOT DETECTED Final   Klebsiella pneumoniae NOT DETECTED NOT DETECTED Final   Proteus species NOT DETECTED NOT DETECTED Final   Serratia marcescens NOT DETECTED NOT DETECTED Final   Carbapenem resistance NOT DETECTED NOT DETECTED Final   Haemophilus influenzae NOT DETECTED NOT DETECTED Final   Neisseria meningitidis NOT DETECTED NOT DETECTED Final   Pseudomonas aeruginosa NOT DETECTED NOT DETECTED Final   Candida albicans NOT DETECTED NOT DETECTED Final   Candida glabrata NOT DETECTED NOT DETECTED Final   Candida krusei NOT DETECTED NOT DETECTED Final   Candida parapsilosis NOT DETECTED NOT DETECTED Final   Candida tropicalis NOT DETECTED NOT DETECTED Final  Culture, blood (Routine X 2) w Reflex to ID Panel     Status: None (Preliminary result)   Collection Time: 04/04/16 10:06 PM  Result Value Ref Range Status   Specimen Description BLOOD LEFT ANTECUBITAL  Final   Special Requests BOTTLES DRAWN AEROBIC AND ANAEROBIC  Final   Culture NO GROWTH < 12 HOURS  Final   Report Status PENDING  Incomplete     Thank you for allowing pharmacy to be a part  of this patient's care.  Olene Floss, Pharm.D Clinical Pharmacist  04/05/2016 9:06 AM

## 2016-04-05 NOTE — Progress Notes (Signed)
[facility]  ROUNDING NOTE   Subjective:   Eating better. Complains of wheezing. Off IV fluids.   Creatinine 2.63 (3.16) (3.89)  Objective:  Vital signs in last 24 hours:  Temp:  [98 F (36.7 C)-98.1 F (36.7 C)] 98.1 F (36.7 C) (04/19 0523) Pulse Rate:  [74-96] 74 (04/19 0523) Resp:  [17-21] 17 (04/19 0523) BP: (119-153)/(56-74) 119/72 mmHg (04/19 0523) SpO2:  [95 %-97 %] 97 % (04/19 0523)  Weight change:  Filed Weights   04/02/16 1914 04/03/16 0139  Weight: 87.544 kg (193 lb) 92.579 kg (204 lb 1.6 oz)    Intake/Output: I/O last 3 completed shifts: In: 1404.8 [P.O.:300; I.V.:1004.8; IV Piggyback:100] Out: 2400 [Urine:2400]   Intake/Output this shift:     Physical Exam: General: NAD,   Head: Normocephalic, atraumatic. Moist oral mucosal membranes  Eyes: Anicteric, PERRL  Neck: Supple, trachea midline  Lungs:  Bilateral wheezing  Heart: Regular rate and rhythm  Abdomen:  Soft, nontender,   Extremities: trace peripheral edema. Right wrist cellulitis  Neurologic: Nonfocal, moving all four extremities  Skin: +erythema       Basic Metabolic Panel:  Recent Labs Lab 04/02/16 1916 04/03/16 0158 04/04/16 0509 04/04/16 2206  NA 134* 135 134* 134*  K 3.9 3.3* 4.0 4.0  CL 102 103 108 105  CO2 18* 20* 16* 15*  GLUCOSE 120* 109* 87 66  BUN 69* 70* 69* 62*  CREATININE 3.66* 3.89* 3.16* 2.63*  CALCIUM 8.2* 7.9* 8.3* 8.4*  PHOS  --   --  5.6* 4.9*    Liver Function Tests:  Recent Labs Lab 04/02/16 1916 04/04/16 0509 04/04/16 2206  AST 27  --   --   ALT 40  --   --   ALKPHOS 164*  --   --   BILITOT 0.2*  --   --   PROT 7.2  --   --   ALBUMIN 3.2* 2.5* 2.4*   No results for input(s): LIPASE, AMYLASE in the last 168 hours. No results for input(s): AMMONIA in the last 168 hours.  CBC:  Recent Labs Lab 04/02/16 1916 04/03/16 0158 04/04/16 0509 04/04/16 2206  WBC 27.6* 25.0* 22.2* 25.0*  NEUTROABS 25.8*  --   --   --   HGB 10.5*  9.2* 9.8* 11.5*  HCT 32.0* 27.6* 30.5* 34.5*  MCV 87.2 88.3 89.3 87.4  PLT 234 214 252 266    Cardiac Enzymes:  Recent Labs Lab 04/02/16 1916  TROPONINI 0.06*    BNP: Invalid input(s): POCBNP  CBG: No results for input(s): GLUCAP in the last 168 hours.  Microbiology: Results for orders placed or performed during the hospital encounter of 04/02/16  Culture, blood (routine x 2)     Status: Abnormal   Collection Time: 04/02/16  7:16 PM  Result Value Ref Range Status   Specimen Description BLOOD LEFT ASSIST CONTROL  Final   Special Requests BOTTLES DRAWN AEROBIC AND ANAEROBIC 1CCAERO,1CCANA  Final   Culture  Setup Time   Final    GRAM POSITIVE COCCI IN BOTH AEROBIC AND ANAEROBIC BOTTLES CRITICAL VALUE NOTED.  VALUE IS CONSISTENT WITH PREVIOUSLY REPORTED AND CALLED VALUE.    Culture (A)  Final    STAPHYLOCOCCUS AUREUS IN BOTH AEROBIC AND ANAEROBIC BOTTLES    Report Status 04/05/2016 FINAL  Final   Organism ID, Bacteria STAPHYLOCOCCUS AUREUS  Final      Susceptibility   Staphylococcus aureus - MIC*    CIPROFLOXACIN <=0.5 SENSITIVE Sensitive     ERYTHROMYCIN <=  0.25 SENSITIVE Sensitive     GENTAMICIN <=0.5 SENSITIVE Sensitive     OXACILLIN 0.5 SENSITIVE Sensitive     TETRACYCLINE 2 SENSITIVE Sensitive     VANCOMYCIN 1 SENSITIVE Sensitive     TRIMETH/SULFA <=10 SENSITIVE Sensitive     CLINDAMYCIN <=0.25 SENSITIVE Sensitive     RIFAMPIN <=0.5 SENSITIVE Sensitive     Inducible Clindamycin NEGATIVE Sensitive     * STAPHYLOCOCCUS AUREUS  Culture, blood (routine x 2)     Status: Abnormal   Collection Time: 04/02/16  7:16 PM  Result Value Ref Range Status   Specimen Description BLOOD RIGHT HAND  Final   Special Requests BOTTLES DRAWN AEROBIC AND ANAEROBIC 1CCAERO,1CCANA  Final   Culture  Setup Time   Final    GRAM POSITIVE COCCI IN BOTH AEROBIC AND ANAEROBIC BOTTLES CRITICAL RESULT CALLED TO, READ BACK BY AND VERIFIED WITH: KAREN HAYES AT 1338 ON 04/03/16 BY CTJ.     Culture (A)  Final    STAPHYLOCOCCUS AUREUS IN BOTH AEROBIC AND ANAEROBIC BOTTLES    Report Status 04/05/2016 FINAL  Final   Organism ID, Bacteria STAPHYLOCOCCUS AUREUS  Final      Susceptibility   Staphylococcus aureus - MIC*    CIPROFLOXACIN <=0.5 SENSITIVE Sensitive     ERYTHROMYCIN <=0.25 SENSITIVE Sensitive     GENTAMICIN <=0.5 SENSITIVE Sensitive     OXACILLIN 0.5 SENSITIVE Sensitive     TETRACYCLINE 2 SENSITIVE Sensitive     VANCOMYCIN 1 SENSITIVE Sensitive     TRIMETH/SULFA <=10 SENSITIVE Sensitive     CLINDAMYCIN <=0.25 SENSITIVE Sensitive     RIFAMPIN <=0.5 SENSITIVE Sensitive     Inducible Clindamycin NEGATIVE Sensitive     * STAPHYLOCOCCUS AUREUS  Urine culture     Status: None   Collection Time: 04/02/16  7:16 PM  Result Value Ref Range Status   Specimen Description URINE, RANDOM  Final   Special Requests NONE  Final   Culture MULTIPLE SPECIES PRESENT, SUGGEST RECOLLECTION  Final   Report Status 04/04/2016 FINAL  Final  Blood Culture ID Panel (Reflexed)     Status: Abnormal   Collection Time: 04/02/16  7:16 PM  Result Value Ref Range Status   Enterococcus species NOT DETECTED NOT DETECTED Final   Vancomycin resistance NOT DETECTED NOT DETECTED Final   Listeria monocytogenes NOT DETECTED NOT DETECTED Final   Staphylococcus species DETECTED (A) NOT DETECTED Final    Comment: CRITICAL RESULT CALLED TO, READ BACK BY AND VERIFIED WITH: KAREN HAYES FOR STAPHYLOCOCCUS SPECIES AND STAPHYLOCOCCUS AUREUS AT 1338 ON 04/03/16 BY CTJ.    Staphylococcus aureus DETECTED (A) NOT DETECTED Final   Methicillin resistance NOT DETECTED NOT DETECTED Final   Streptococcus species NOT DETECTED NOT DETECTED Final   Streptococcus agalactiae NOT DETECTED NOT DETECTED Final   Streptococcus pneumoniae NOT DETECTED NOT DETECTED Final   Streptococcus pyogenes NOT DETECTED NOT DETECTED Final   Acinetobacter baumannii NOT DETECTED NOT DETECTED Final   Enterobacteriaceae species NOT DETECTED NOT  DETECTED Final   Enterobacter cloacae complex NOT DETECTED NOT DETECTED Final   Escherichia coli NOT DETECTED NOT DETECTED Final   Klebsiella oxytoca NOT DETECTED NOT DETECTED Final   Klebsiella pneumoniae NOT DETECTED NOT DETECTED Final   Proteus species NOT DETECTED NOT DETECTED Final   Serratia marcescens NOT DETECTED NOT DETECTED Final   Carbapenem resistance NOT DETECTED NOT DETECTED Final   Haemophilus influenzae NOT DETECTED NOT DETECTED Final   Neisseria meningitidis NOT DETECTED NOT DETECTED Final   Pseudomonas  aeruginosa NOT DETECTED NOT DETECTED Final   Candida albicans NOT DETECTED NOT DETECTED Final   Candida glabrata NOT DETECTED NOT DETECTED Final   Candida krusei NOT DETECTED NOT DETECTED Final   Candida parapsilosis NOT DETECTED NOT DETECTED Final   Candida tropicalis NOT DETECTED NOT DETECTED Final  Urine culture     Status: None (Preliminary result)   Collection Time: 04/04/16  4:44 PM  Result Value Ref Range Status   Specimen Description URINE, RANDOM  Final   Special Requests NONE  Final   Culture NO GROWTH < 24 HOURS  Final   Report Status PENDING  Incomplete  Culture, blood (Routine X 2) w Reflex to ID Panel     Status: None (Preliminary result)   Collection Time: 04/04/16 10:06 PM  Result Value Ref Range Status   Specimen Description BLOOD LEFT ANTECUBITAL  Final   Special Requests BOTTLES DRAWN AEROBIC AND ANAEROBIC  Final   Culture NO GROWTH < 12 HOURS  Final   Report Status PENDING  Incomplete    Coagulation Studies: No results for input(s): LABPROT, INR in the last 72 hours.  Urinalysis:  Recent Labs  04/02/16 1916  COLORURINE AMBER*  LABSPEC 1.016  PHURINE 5.0  GLUCOSEU 50*  HGBUR 2+*  BILIRUBINUR NEGATIVE  KETONESUR NEGATIVE  PROTEINUR 100*  NITRITE NEGATIVE  LEUKOCYTESUR 1+*      Imaging: US Renal  04/03/2016  CLINICAL DATA:  Acute kidney injury. EXAM: RENAL / URINARY TRACT ULTRASOUND COMPLETE COMPARISON:  None. FINDINGS: Right  Kidney: Length: 10.0 cm. Echogenicity within normal limits. No mass or hydronephrosis visualized. Left Kidney: Length: 9.8 cm. Echogenicity within normal limits. No mass or hydronephrosis visualized. Bladder: Appears normal for degree of bladder distention ; the bladder is nondistended due to the presence of a urinary bladder catheter. IMPRESSION: 1. No hydronephrosis or specific sonographic abnormality of the kidneys or urinary bladder identified. Electronically Signed   By: Gaylyn Rong M.D.   On: 04/03/2016 14:41     Medications:     .  ceFAZolin (ANCEF) IV  2 g Intravenous Q12H  . guaiFENesin  600 mg Oral BID  . heparin  5,000 Units Subcutaneous 3 times per day  . pantoprazole  40 mg Oral QAC breakfast  . senna-docusate  1 tablet Oral BID  . sodium chloride  500 mL Intravenous Once  . tiZANidine  4 mg Oral QHS  . venlafaxine XR  37.5 mg Oral Q breakfast   acetaminophen **OR** acetaminophen, albuterol, ALPRAZolam, diphenhydrAMINE, ondansetron **OR** ondansetron (ZOFRAN) IV  Assessment/ Plan:  Danielle [name] is a 60 y.o. white female with GERD, osteoarthritis and hyperlipidemia, who was admitted to [facility] on 04/02/2016   1. Acute renal failure with proteinuria and hematuria: baseline creatinine of 0.69 from 4/12. On meloxicam at home. Seems to have been given toradol at [facility] ED last week.  . Continue to monitor volume status and urine output.  - serologies pending - Renal ultrasound reviewed.  - No acute indication for dialysis. Renally dose all medications. Holding nephrotoxic agents including meloxicam and other NSAIDs.   2. Urinary Tract Infection: patient was asymptomatic with dysuria, fevers and chills on admission. However now with septic arthritis of right wrist.  - cefazolin    LOS: 2 Danielle [name] 4/19/201710:59 AM

## 2016-04-05 NOTE — Progress Notes (Signed)
[facility] [place] [facility]   PATIENT NAME: Danielle GaulRebecca [name]    MR#:  [id number]  DATE OF BIRTH:  1956-08-28  SUBJECTIVE:   Patient is resting comfortably. . Denies any bug bites or insect bites. Weak Right arm redness improving. No fever  REVIEW OF SYSTEMS:  CONSTITUTIONAL: No fever, fatigue or weakness.  EYES: No blurred or double vision.  EARS, NOSE, AND THROAT: No tinnitus or ear pain.  RESPIRATORY: No cough, shortness of breath, wheezing or hemoptysis.  CARDIOVASCULAR: No chest pain, orthopnea, edema.  GASTROINTESTINAL: No nausea, vomiting, diarrhea or abdominal pain.  GENITOURINARY: No dysuria, hematuria.  ENDOCRINE: No polyuria, nocturia,  HEMATOLOGY: No anemia, easy bruising or bleeding SKIN: right UE redness MUSCULOSKELETAL: Has chronic arthritis. Currently right hand and wrist joints are swollen  NEUROLOGIC: No tingling, numbness, weakness.  PSYCHIATRY: No anxiety or depression.   DRUG ALLERGIES:   Allergies  Allergen Reactions  . Compazine [Prochlorperazine Edisylate] Nausea And Vomiting    VITALS:  Blood pressure 119/72, pulse 74, temperature 98.1 F (36.7 C), temperature source Oral, resp. rate 17, height 5\' 1"  (1.549 m), weight 92.579 kg (204 lb 1.6 oz), SpO2 97 %.  PHYSICAL EXAMINATION:  GENERAL:  60 y.o.-year-old patient lying in the bed with no acute distress.  EYES: Pupils equal, round, reactive to light and accommodation. No scleral icterus. Extraocular muscles intact.  HEENT: Head atraumatic, normocephalic. Oropharynx and nasopharynx clear.  NECK:  Supple, no jugular venous distention. No thyroid enlargement, no tenderness.  LUNGS: Normal breath sounds bilaterally, no wheezing, rales,rhonchi or crepitation. No use of accessory muscles of respiration.  CARDIOVASCULAR: S1, S2 normal. No murmurs, rubs, or gallops.  ABDOMEN: Soft, nontender, nondistended. Bowel sounds present. No organomegaly or mass.  EXTREMITIES: Right  wrist joint and dorsal aspect of the hand are edematous and tender with decreased range of motion, radial pulse is 1-2+ .No pedal edema, cyanosis, or clubbing.  NEUROLOGIC: Cranial nerves II through XII are intact. Muscle strength 5/5 in all extremities. Sensation intact. Gait not checked.  PSYCHIATRIC: The patient is alert and oriented x 3.  SKIN: No obvious rash, lesion, or ulcer.    LABORATORY PANEL:   CBC  Recent Labs Lab 04/04/16 2206  WBC 25.0*  HGB 11.5*  HCT 34.5*  PLT 266   ------------------------------------------------------------------------------------------------------------------  Chemistries   Recent Labs Lab 04/02/16 1916  04/04/16 2206  NA 134*  < > 134*  K 3.9  < > 4.0  CL 102  < > 105  CO2 18*  < > 15*  GLUCOSE 120*  < > 66  BUN 69*  < > 62*  CREATININE 3.66*  < > 2.63*  CALCIUM 8.2*  < > 8.4*  AST 27  --   --   ALT 40  --   --   ALKPHOS 164*  --   --   BILITOT 0.2*  --   --   < > = values in this interval not displayed. ------------------------------------------------------------------------------------------------------------------  Cardiac Enzymes  Recent Labs Lab 04/02/16 1916  TROPONINI 0.06*   ------------------------------------------------------------------------------------------------------------------  RADIOLOGY:  [place] Renal  04/03/2016  CLINICAL DATA:  Acute kidney injury. EXAM: RENAL / URINARY TRACT ULTRASOUND COMPLETE COMPARISON:  None. FINDINGS: Right Kidney: Length: 10.0 cm. Echogenicity within normal limits. No mass or hydronephrosis visualized. Left Kidney: Length: 9.8 cm. Echogenicity within normal limits. No mass or hydronephrosis visualized. Bladder: Appears normal for degree of bladder distention ; the bladder is nondistended due to the presence of a urinary  bladder catheter. IMPRESSION: 1. No hydronephrosis or specific sonographic abnormality of the kidneys or urinary bladder identified. Electronically Signed   By: Gaylyn Rong M.D.   On: 04/03/2016 14:41    EKG:   Orders placed or performed during the hospital encounter of 04/02/16  . EKG 12-Lead  . EKG 12-Lead    ASSESSMENT AND PLAN:   60 year old female patient with history of osteoarthritis, GERD, back pain presented to the emergency room with generalized weakness and transient disorientation.  # Acute kidney injury probably from dehydration from poor by mouth intake and taking meloxicam for back pain (recently prescribed) continue IV fluids and monitor renal function closely. Avoid nephrotoxins  normal renal ultrasound and Foley catheter placement for monitoring output   nephrology consult appreciated Creat 3.69--3.89--3.16--2.63 -IVF d/ced by nephrology  #Right hand swelling/cellulitis with MSSA sepsis -Hand x-ray with no acute findings -No evidence of abscess per Dr.Menz -IV cefazolin per ID -redness improved. No s/o abscess  #. Urinary tract infection -UC mixed bacterial contamination  #. Leukocytosis which could be secondary to sepsis 27K--25k--22.2K--25K--cbc today pending  # Osteoarthritis Pain medications as needed   #. GERD-PPI   #DVT prophylaxis with subcutaneous heparin  All the records are reviewed and case discussed with Care Management/Social Workerr. Management plans discussed with the patient, and sister and they are in agreement.  CODE STATUS: full code  TOTAL TIME TAKING CARE OF THIS PATIENT: 25 minutes.   POSSIBLE D/C IN 2-3 DAYS, DEPENDING ON CLINICAL CONDITION.   Rylyn Zawistowski M.D on 04/05/2016 at 9:08 AM  Between 7am to 6pm - Pager - [phone] After 6pm go to [email or web address] - password [email or web address] [facility]  [facility]  Office  [phone]  CC: Primary care physician; No primary care provider on file.

## 2016-04-05 NOTE — Progress Notes (Signed)
Physical Therapy Treatment Patient Details Name: Danielle PatchesRebecca A [name] MRN: [id number] DOB: [birth year] Today's Date: 04/05/2016    History of Present Illness Danielle GaulRebecca [name] is a 60 y.o. female with a known history of Osteoarthritis, GERD presented to the emergency room because of weakness and somnolence. Patient had been recently [facility] the orthopedic doctor and [facility] and she is on pain medication and also completed her dose of oral steroids. Her last day of oral steroids for this today. According to the family members patient has been more lethargic and somnolent after being on pain medication. Patient is awake and alert and responds to all verbal commands in the emergency room today. Has history of dysuria. No history of any chest pain or shortness of breath. Has generalized weakness. Upon evaluation the emergency room she appeared to be dehydrated and also her creatinine was elevated. Patient was started on IV fluids and the received a IV antibiotic in the emergency room for possible urinary tract infection.No complaints of cough. No history or recent travel or sick contacts at home. Pt reports 1 fall in the last 12 month.    PT Comments    Patient has complex medical history and continues to present with pain in her lumbar spine radiating into her R hip. It appears x-rays have not indicated any specific pathology for this and she is not considered to have any protective weight-bearing status. She reports improved function and decreased pain in her R hand in this session (appears to have decreased swelling per patient). She is quite limited in mobility due to her R hip pain. Appears unchanged from the previous few days, unclear what etiology is. Patient would continue to benefit from skilled PT services and continued evaluation of R hip pain.   Follow Up Recommendations  SNF     Equipment Recommendations       Recommendations for Other Services       Precautions  / Restrictions Precautions Precautions: Fall Restrictions Weight Bearing Restrictions: No    Mobility  Bed Mobility Overal bed mobility: Needs Assistance Bed Mobility: Supine to Sit     Supine to sit: Mod assist     General bed mobility comments: Patient struggles to bring RLE to R side of bed secondary to pain, requires assistance to bring her trunk off the bed surface as she is unable/unwilling to use RUE to bring her trunk upright.   Transfers Overall transfer level: Needs assistance Equipment used: Rolling walker (2 wheeled) Transfers: Sit to/from Stand Sit to Stand: Min assist;Mod assist         General transfer comment: Patient is able to use RUE for transfer, however she is markedly limited secondary to RLE increasing in pain with transfer.   Ambulation/Gait Ambulation/Gait assistance: Min assist;Mod assist Ambulation Distance (Feet): 3 Feet (2 bouts) Assistive device: Rolling walker (2 wheeled) Gait Pattern/deviations: Step-to pattern;Shuffle;Decreased step length - right;Decreased step length - left;Antalgic   Gait velocity interpretation: at or above normal speed for age/gender General Gait Details: Patient really struggles to bear weight on RLE today, she really slides it along more or less for pain control. RW appears to assist somewhat in pain control, though limited.    Stairs            Wheelchair Mobility    Modified Rankin (Stroke Patients Only)       Balance Overall balance assessment: Needs assistance Sitting-balance support: Feet supported Sitting balance-Leahy Scale: Good     Standing  balance support: Bilateral upper extremity supported Standing balance-Leahy Scale: Poor                      Cognition Arousal/Alertness: Awake/alert Behavior During Therapy: WFL for tasks assessed/performed Overall Cognitive Status: Within Functional Limits for tasks assessed                      Exercises Other Exercises Other  Exercises: Assisted patient with transfer to and from bedside commode with min A x 1 and cuing for use of RW/HHA    General Comments General comments (skin integrity, edema, etc.): Mild redness over L shoulder, edema noted in R hand.       Pertinent Vitals/Pain Pain Assessment: Faces Faces Pain Scale: Hurts whole lot Pain Location: Middle of lumbar spine radiating to R hip Pain Intervention(s): Limited activity within patient's tolerance;Monitored during session;Patient requesting pain meds-RN notified;Repositioned    Home Living                      Prior Function            PT Goals (current goals can now be found in the care plan section) Acute Rehab PT Goals Patient Stated Goal: "I want to get better." PT Goal Formulation: With patient Time For Goal Achievement: 04/17/16 Potential to Achieve Goals: Good Progress towards PT goals: Progressing toward goals    Frequency  Min 2X/week    PT Plan Current plan remains appropriate    Co-evaluation             End of Session Equipment Utilized During Treatment: Gait belt Activity Tolerance: Patient limited by pain Patient left: in chair;with call bell/phone within reach;with chair alarm set;with family/visitor present     Time: 1001-1025 PT Time Calculation (min) (ACUTE ONLY): 24 min  Charges:  $Gait Training: 8-22 mins $Therapeutic Activity: 8-22 mins                    G Codes:      Kerin Ransom, PT, DPT    04/05/2016, 3:43 PM

## 2016-04-05 NOTE — Progress Notes (Signed)
[place] INFECTIOUS DISEASE PROGRESS NOTE Date of Admission:  04/02/2016     ID: Danielle [name] is a 61 y.o. female with MSSA bacteremia, hand cellulitis, LBP Principal Problem:   Dehydration Active Problems:   Acute renal failure (ARF) (HCC)   Acute renal failure (HCC)  Subjective: NO fever, wbc up to 25. Still with hand pain and swelling but reports some improvement Still with 9/10  LBP   ROS  Eleven systems are reviewed and negative except per hpi  Medications:  Antibiotics Given (last 72 hours)    Date/Time Action Medication Dose Rate   04/03/16 0227 Given   cefTRIAXone (ROCEPHIN) 1 g in dextrose 5 % 50 mL IVPB 1 g 100 mL/hr   04/03/16 1606 Given   ceFAZolin (ANCEF) IVPB 2g/100 mL premix 2 g 200 mL/hr   04/04/16 0355 Given   ceFAZolin (ANCEF) IVPB 2g/100 mL premix 2 g 200 mL/hr   04/04/16 1951 Given  [IV infiltrated atr time due, now have Iv access]   ceFAZolin (ANCEF) IVPB 2g/100 mL premix 2 g 200 mL/hr   04/05/16 0759 Given   ceFAZolin (ANCEF) IVPB 2g/100 mL premix 2 g 200 mL/hr     .  ceFAZolin (ANCEF) IV  2 g Intravenous Q12H  . guaiFENesin  600 mg Oral BID  . heparin  5,000 Units Subcutaneous 3 times per day  . pantoprazole  40 mg Oral QAC breakfast  . senna-docusate  1 tablet Oral BID  . sodium chloride  500 mL Intravenous Once  . tiZANidine  4 mg Oral QHS  . venlafaxine XR  37.5 mg Oral Q breakfast    Objective: Vital signs in last 24 hours: Temp:  [98 F (36.7 C)-98.1 F (36.7 C)] 98.1 F (36.7 C) (04/19 0523) Pulse Rate:  [74-93] 74 (04/19 0523) Resp:  [17-21] 17 (04/19 0523) BP: (119-153)/(56-74) 119/72 mmHg (04/19 0523) SpO2:  [97 %] 97 % (04/19 0523) Constitutional: oriented to person, place, and time. appears well-developed and well-nourished. No distress.  HENT: [place]/AT, PERRLA, no scleral icterus Mouth/Throat: Oropharynx is clear and moist. No oropharyngeal exudate.  Cardiovascular: Normal rate, regular rhythm 2/6 sm   Pulmonary/Chest: bil rhonchi and wheeze Neck = supple, no nuchal rigidity Abdominal: Soft. Bowel sounds are normal. exhibits no distension. There is no tenderness.  Lymphadenopathy: no cervical adenopathy. No axillary adenopathy Neurological: alert and oriented to person, place, and time.  Skin: R dorsum of hand with marked swelling, tendernss, warmth.  Psychiatric: a normal mood and affect. behavior is normal.   Lab Results  Recent Labs  04/04/16 0509 04/04/16 2206  WBC 22.2* 25.0*  HGB 9.8* 11.5*  HCT 30.5* 34.5*  NA 134* 134*  K 4.0 4.0  CL 108 105  CO2 16* 15*  BUN 69* 62*  CREATININE 3.16* 2.63*    Microbiology: Results for orders placed or performed during the hospital encounter of 04/02/16  Culture, blood (routine x 2)     Status: Abnormal   Collection Time: 04/02/16  7:16 PM  Result Value Ref Range Status   Specimen Description BLOOD LEFT ASSIST CONTROL  Final   Special Requests BOTTLES DRAWN AEROBIC AND ANAEROBIC [place]  Final   Culture  Setup Time   Final    GRAM POSITIVE COCCI IN BOTH AEROBIC AND ANAEROBIC BOTTLES CRITICAL VALUE NOTED.  VALUE IS CONSISTENT WITH PREVIOUSLY REPORTED AND CALLED VALUE.    Culture (A)  Final    STAPHYLOCOCCUS AUREUS IN BOTH AEROBIC AND ANAEROBIC BOTTLES    Report  Status 04/05/2016 FINAL  Final   Organism ID, Bacteria STAPHYLOCOCCUS AUREUS  Final      Susceptibility   Staphylococcus aureus - MIC*    CIPROFLOXACIN <=0.5 SENSITIVE Sensitive     ERYTHROMYCIN <=0.25 SENSITIVE Sensitive     GENTAMICIN <=0.5 SENSITIVE Sensitive     OXACILLIN 0.5 SENSITIVE Sensitive     TETRACYCLINE 2 SENSITIVE Sensitive     VANCOMYCIN 1 SENSITIVE Sensitive     TRIMETH/SULFA <=10 SENSITIVE Sensitive     CLINDAMYCIN <=0.25 SENSITIVE Sensitive     RIFAMPIN <=0.5 SENSITIVE Sensitive     Inducible Clindamycin NEGATIVE Sensitive     * STAPHYLOCOCCUS AUREUS  Culture, blood (routine x 2)     Status: Abnormal   Collection Time: 04/02/16   7:16 PM  Result Value Ref Range Status   Specimen Description BLOOD RIGHT HAND  Final   Special Requests BOTTLES DRAWN AEROBIC AND ANAEROBIC [place]  Final   Culture  Setup Time   Final    GRAM POSITIVE COCCI IN BOTH AEROBIC AND ANAEROBIC BOTTLES CRITICAL RESULT CALLED TO, READ BACK BY AND VERIFIED WITH: KAREN HAYES AT 1638 ON 04/03/16 BY CTJ.    Culture (A)  Final    STAPHYLOCOCCUS AUREUS IN BOTH AEROBIC AND ANAEROBIC BOTTLES    Report Status 04/05/2016 FINAL  Final   Organism ID, Bacteria STAPHYLOCOCCUS AUREUS  Final      Susceptibility   Staphylococcus aureus - MIC*    CIPROFLOXACIN <=0.5 SENSITIVE Sensitive     ERYTHROMYCIN <=0.25 SENSITIVE Sensitive     GENTAMICIN <=0.5 SENSITIVE Sensitive     OXACILLIN 0.5 SENSITIVE Sensitive     TETRACYCLINE 2 SENSITIVE Sensitive     VANCOMYCIN 1 SENSITIVE Sensitive     TRIMETH/SULFA <=10 SENSITIVE Sensitive     CLINDAMYCIN <=0.25 SENSITIVE Sensitive     RIFAMPIN <=0.5 SENSITIVE Sensitive     Inducible Clindamycin NEGATIVE Sensitive     * STAPHYLOCOCCUS AUREUS  Urine culture     Status: None   Collection Time: 04/02/16  7:16 PM  Result Value Ref Range Status   Specimen Description URINE, RANDOM  Final   Special Requests NONE  Final   Culture MULTIPLE SPECIES PRESENT, SUGGEST RECOLLECTION  Final   Report Status 04/04/2016 FINAL  Final  Blood Culture ID Panel (Reflexed)     Status: Abnormal   Collection Time: 04/02/16  7:16 PM  Result Value Ref Range Status   Enterococcus species NOT DETECTED NOT DETECTED Final   Vancomycin resistance NOT DETECTED NOT DETECTED Final   Listeria monocytogenes NOT DETECTED NOT DETECTED Final   Staphylococcus species DETECTED (A) NOT DETECTED Final    Comment: CRITICAL RESULT CALLED TO, READ BACK BY AND VERIFIED WITH: KAREN HAYES FOR STAPHYLOCOCCUS SPECIES AND STAPHYLOCOCCUS AUREUS AT 1338 ON 04/03/16 BY CTJ.    Staphylococcus aureus DETECTED (A) NOT DETECTED Final   Methicillin resistance NOT  DETECTED NOT DETECTED Final   Streptococcus species NOT DETECTED NOT DETECTED Final   Streptococcus agalactiae NOT DETECTED NOT DETECTED Final   Streptococcus pneumoniae NOT DETECTED NOT DETECTED Final   Streptococcus pyogenes NOT DETECTED NOT DETECTED Final   Acinetobacter baumannii NOT DETECTED NOT DETECTED Final   Enterobacteriaceae species NOT DETECTED NOT DETECTED Final   Enterobacter cloacae complex NOT DETECTED NOT DETECTED Final   Escherichia coli NOT DETECTED NOT DETECTED Final   Klebsiella oxytoca NOT DETECTED NOT DETECTED Final   Klebsiella pneumoniae NOT DETECTED NOT DETECTED Final   Proteus species NOT DETECTED NOT DETECTED Final  Serratia marcescens NOT DETECTED NOT DETECTED Final   Carbapenem resistance NOT DETECTED NOT DETECTED Final   Haemophilus influenzae NOT DETECTED NOT DETECTED Final   Neisseria meningitidis NOT DETECTED NOT DETECTED Final   Pseudomonas aeruginosa NOT DETECTED NOT DETECTED Final   Candida albicans NOT DETECTED NOT DETECTED Final   Candida glabrata NOT DETECTED NOT DETECTED Final   Candida krusei NOT DETECTED NOT DETECTED Final   Candida parapsilosis NOT DETECTED NOT DETECTED Final   Candida tropicalis NOT DETECTED NOT DETECTED Final  Urine culture     Status: None (Preliminary result)   Collection Time: 04/04/16  4:44 PM  Result Value Ref Range Status   Specimen Description URINE, RANDOM  Final   Special Requests NONE  Final   Culture NO GROWTH < 24 HOURS  Final   Report Status PENDING  Incomplete  Culture, blood (Routine X 2) w Reflex to ID Panel     Status: None (Preliminary result)   Collection Time: 04/04/16 10:06 PM  Result Value Ref Range Status   Specimen Description BLOOD LEFT ANTECUBITAL  Final   Special Requests BOTTLES DRAWN AEROBIC AND ANAEROBIC 5ML  Final   Culture NO GROWTH < 12 HOURS  Final   Report Status PENDING  Incomplete    Studies/Results: US Renal  04/03/2016  CLINICAL DATA:  Acute kidney injury. EXAM: RENAL /  URINARY TRACT ULTRASOUND COMPLETE COMPARISON:  None. FINDINGS: Right Kidney: Length: 10.0 cm. Echogenicity within normal limits. No mass or hydronephrosis visualized. Left Kidney: Length: 9.8 cm. Echogenicity within normal limits. No mass or hydronephrosis visualized. Bladder: Appears normal for degree of bladder distention ; the bladder is nondistended due to the presence of a urinary bladder catheter. IMPRESSION: 1. No hydronephrosis or specific sonographic abnormality of the kidneys or urinary bladder identified. Electronically Signed   By: Van Clines M.D.   On: 04/03/2016 14:41   Echo Study Conclusions  - Left ventricle: The cavity size was normal. Systolic function was  normal. The estimated ejection fraction was in the range of 60%  to 65%. Wall motion was normal; there were no regional wall  motion abnormalities. Left ventricular diastolic function  parameters were normal. - Mitral valve: There was mild regurgitation. - Left atrium: The atrium was normal in size. - Right ventricle: Systolic function was normal. - Pulmonary arteries: Systolic pressure was mildly elevated. PA  peak pressure: 43 mm Hg (S).  Impressions:  - No valve vegetation noted.  Assessment/Plan: Assessment:  ANTONIETA SLAVEN is a 60 y.o. female with MSSA bacteremia, R wrist swelling and cellulitis (possible septic arthritis) and LBP (concern for discitis but MRI 4/12 at [facility] negative). Also some L shoulder pain  ESR 109, HIV neg, CRP P,  FU Bcx 4/18 ngtd ECHO TTE - no veg, mild MR  Recommendations Continue Ancef Elevate R arm TTE negative- will hold on TEE for now pending MRI back  If bcx neg and renal function improving can place picc line but would wait until prior to DC as has L neck line currently Given persistent severe back pain will order  MRI  as still could have osteo/discitis Will need minimum 2 weeks IV abx from first negative culture Thank you very much for the consult. Will  follow with you.  [place], DAVID P   04/05/2016, 12:26 PM

## 2016-04-06 ENCOUNTER — Encounter
Admission: RE | Admit: 2016-04-06 | Discharge: 2016-04-06 | Disposition: A | Discharge status: Home / Self Care | Payer: Commercial Managed Care - PPO | Source: Ambulatory Visit | Attending: Internal Medicine | Admitting: Internal Medicine

## 2016-04-06 ENCOUNTER — Inpatient Hospital Stay: Payer: Commercial Managed Care - PPO

## 2016-04-06 DIAGNOSIS — A4101 Sepsis due to Methicillin susceptible Staphylococcus aureus: Secondary | ICD-10-CM | POA: Insufficient documentation

## 2016-04-06 LAB — HEPATITIS B CORE ANTIBODY, IGM: HEP B C IGM: NEGATIVE

## 2016-04-06 LAB — RENAL FUNCTION PANEL
ALBUMIN: 2.4 g/dL — AB (ref 3.5–5.0)
ANION GAP: 8 (ref 5–15)
BUN: 54 mg/dL — ABNORMAL HIGH (ref 6–20)
CALCIUM: 9 mg/dL (ref 8.9–10.3)
CO2: 21 mmol/L — AB (ref 22–32)
CREATININE: 1.64 mg/dL — AB (ref 0.44–1.00)
Chloride: 112 mmol/L — ABNORMAL HIGH (ref 101–111)
GFR calc non Af Amer: 33 mL/min — ABNORMAL LOW (ref >60)
GFR, EST AFRICAN AMERICAN: 38 mL/min — AB (ref >60)
Glucose, Bld: 89 mg/dL (ref 65–99)
PHOSPHORUS: 4.2 mg/dL (ref 2.5–4.6)
Potassium: 4.1 mmol/L (ref 3.5–5.1)
SODIUM: 141 mmol/L (ref 135–145)

## 2016-04-06 LAB — GLOMERULAR BASEMENT MEMBRANE ANTIBODIES: GBM Ab: 4 units (ref 0–20)

## 2016-04-06 LAB — PROTEIN ELECTROPHORESIS, SERUM
A/G RATIO SPE: 0.7 (ref 0.7–1.7)
ALBUMIN ELP: 2.4 g/dL — AB (ref 2.9–4.4)
ALPHA-2-GLOBULIN: 1.3 g/dL — AB (ref 0.4–1.0)
Alpha-1-Globulin: 0.4 g/dL (ref 0.0–0.4)
BETA GLOBULIN: 1 g/dL (ref 0.7–1.3)
GAMMA GLOBULIN: 0.8 g/dL (ref 0.4–1.8)
Globulin, Total: 3.5 g/dL (ref 2.2–3.9)
Total Protein ELP: 5.9 g/dL — ABNORMAL LOW (ref 6.0–8.5)

## 2016-04-06 LAB — C3 COMPLEMENT: C3 COMPLEMENT: 197 mg/dL — AB (ref 82–167)

## 2016-04-06 LAB — HEPATITIS B SURFACE ANTIGEN: HEP B S AG: NEGATIVE

## 2016-04-06 LAB — C4 COMPLEMENT: Complement C4, Body Fluid: 39 mg/dL (ref 14–44)

## 2016-04-06 LAB — URINE CULTURE: Culture: NO GROWTH

## 2016-04-06 LAB — MPO/PR-3 (ANCA) ANTIBODIES

## 2016-04-06 LAB — HEPATITIS C ANTIBODY: HCV Ab: <0.1 s/co ratio (ref 0.0–0.9)

## 2016-04-06 LAB — HEPATITIS B SURFACE ANTIBODY,QUALITATIVE: HEP B S AB: NONREACTIVE

## 2016-04-06 MED ORDER — ENOXAPARIN SODIUM 40 MG/0.4ML ~~LOC~~ SOLN
40.0000 mg | SUBCUTANEOUS | Status: DC
  Administered 2016-04-06 – 2016-04-09 (×4): 40 mg via SUBCUTANEOUS
  Filled 2016-04-06 (×4): qty 0.4

## 2016-04-06 MED ORDER — CEFAZOLIN SODIUM-DEXTROSE 2-4 GM/100ML-% IV SOLN
2.0000 g | Freq: Three times a day (TID) | INTRAVENOUS | Status: DC
  Administered 2016-04-06 – 2016-04-10 (×12): 2 g via INTRAVENOUS
  Filled 2016-04-06 (×14): qty 100

## 2016-04-06 MED ORDER — POLYETHYLENE GLYCOL 3350 17 G PO PACK
17.0000 g | PACK | Freq: Every day | ORAL | Status: DC
  Administered 2016-04-06: 17 g via ORAL
  Filled 2016-04-06: qty 1

## 2016-04-06 MED ORDER — OXYCODONE-ACETAMINOPHEN 5-325 MG PO TABS
1.0000 | ORAL_TABLET | Freq: Four times a day (QID) | ORAL | Status: DC | PRN
Start: 2016-04-06 — End: 2016-04-10
  Administered 2016-04-06 – 2016-04-10 (×11): 2 via ORAL
  Filled 2016-04-06 (×11): qty 2

## 2016-04-06 MED ORDER — MORPHINE SULFATE (PF) 2 MG/ML IV SOLN
2.0000 mg | Freq: Four times a day (QID) | INTRAVENOUS | Status: DC | PRN
  Administered 2016-04-06: 2 mg via INTRAVENOUS
  Filled 2016-04-06 (×2): qty 1

## 2016-04-06 NOTE — Consult Note (Signed)
[facility]  CARDIOLOGY CONSULT NOTE  Patient ID: Danielle PatchesRebecca A [name] MRN: [id number] DOB/AGE: May 13, 1956 60 y.o.  Admit date: 04/02/2016 Referring Physician Allena KatzPatel Primary Physician   Primary Cardiologist   Reason for Consultation for tee  HPI: 60 yo female wth no cardiac history who was admitted wth sob with alterred he was dehydrated on admission. She was placed on iv abx . She had been treated with steroids for back pain. Blood cultures grew staph aureus. Echo read as showing no evidence of vegetations.  Asked to proceed with tee to evaluate valves.   Review of Systems  Constitutional: Positive for fever and chills.  HENT: Negative.   Eyes: Negative.   Respiratory: Negative.   Cardiovascular: Negative.   Gastrointestinal: Negative.   Genitourinary: Negative.   Musculoskeletal: Negative.   Skin: Negative.   Endo/Heme/Allergies: Negative.   Psychiatric/Behavioral: Negative.     Past Medical History  Diagnosis Date  . GERD (gastroesophageal reflux disease)   . Osteoarthritis     Family History  Problem Relation Age of Onset  . Breast cancer Neg Hx     Social History   Social History  . Marital Status: Married    Spouse Name: N/A  . Number of Children: N/A  . Years of Education: N/A   Occupational History  . unemployed    Social History Main Topics  . Smoking status: Never Smoker   . Smokeless tobacco: Not on file  . Alcohol Use: No  . Drug Use: No  . Sexual Activity: Not on file   Other Topics Concern  . Not on file   Social History Narrative    Past Surgical History  Procedure Laterality Date  . Breast excisional biopsy Left     1981 cyst removed  . Ovarian cyst surgery       Prescriptions prior to admission  Medication Sig Dispense Refill Last Dose  . albuterol (PROVENTIL HFA;VENTOLIN HFA) 108 (90 Base) MCG/ACT inhaler Inhale 2 puffs into the lungs every 6 (six) hours as needed for wheezing or shortness of  breath.   PRN at PRN  . ALPRAZolam (XANAX) 0.5 MG tablet Take 0.5 mg by mouth at bedtime as needed for anxiety.   PRN at PRN  . desvenlafaxine (PRISTIQ) 50 MG 24 hr tablet Take 50 mg by mouth daily.   04/02/2016 at Unknown time  . meloxicam (MOBIC) 15 MG tablet Take 15 mg by mouth daily.   04/02/2016 at Unknown time  . pantoprazole (PROTONIX) 40 MG tablet Take 40 mg by mouth daily.   04/02/2016 at Unknown time  . tiZANidine (ZANAFLEX) 4 MG tablet Take 4 mg by mouth at bedtime.   04/01/2016 at Unknown time  . traMADol (ULTRAM) 50 MG tablet Take 50 mg by mouth every 6 (six) hours as needed.   PRN at PRN    Physical Exam: Blood pressure 156/61, pulse 84, temperature 98.6 F (37 C), temperature source Oral, resp. rate 18, height 5\' 1"  (1.549 m), weight 92.579 kg (204 lb 1.6 oz), SpO2 98 %.   Wt Readings from Last 1 Encounters:  04/03/16 92.579 kg (204 lb 1.6 oz)     General appearance: alert and cooperative Resp: clear to auscultation bilaterally Cardio: regular rate and rhythm GI: soft, non-tender; bowel sounds normal; no masses,  no organomegaly Extremities: extremities normal, atraumatic, no cyanosis or edema Neurologic: Grossly normal  Labs:   Lab Results  Component Value Date   WBC 25.0* 04/04/2016   HGB  11.5* 04/04/2016   HCT 34.5* 04/04/2016   MCV 87.4 04/04/2016   PLT 266 04/04/2016    Recent Labs Lab 04/02/16 1916  04/06/16 0352  NA 134*  < > 141  K 3.9  < > 4.1  CL 102  < > 112*  CO2 18*  < > 21*  BUN 69*  < > 54*  CREATININE 3.66*  < > 1.64*  CALCIUM 8.2*  < > 9.0  PROT 7.2  --   --   BILITOT 0.2*  --   --   ALKPHOS 164*  --   --   ALT 40  --   --   AST 27  --   --   GLUCOSE 120*  < > 89  < > = values in this interval not displayed. Lab Results  Component Value Date   CKTOTAL 71 06/05/2013   CKMB 1.1 06/05/2013   TROPONINI 0.06* 04/02/2016      Radiology: no acute airspaace disease EKG: nsr with no ischemia  ASSESSMENT AND PLAN:  Pt with staph a  bactermia an transthoracic echo read as showing no vegetations. Asked to proceed with tee. Risk and benefits discussed with patient. Will proceed with tee in am Signed: Dalia Heading MD, [facility] 04/06/2016, 7:39 PM

## 2016-04-06 NOTE — Plan of Care (Signed)
Problem: Activity: Goal: Risk for activity intolerance will decrease Outcome: Progressing Very weak, refused to get out of bed  Problem: Fluid Volume: Goal: Ability to maintain a balanced intake and output will improve Outcome: Not Progressing Poor appetite

## 2016-04-06 NOTE — Consult Note (Signed)
Pharmacy Antibiotic Note  Danielle [name] is a 60 y.o. female admitted on 04/02/2016 with bacteremia.  Pharmacy has been consulted for cefazolin dosing.  Plan: BCID results: MSSA  Renal function improving. Change to Cefazolin 2g IV q 8 hours. Pt will need at least 2 weeks of IV abx from neg blood culture per ID. Pt to have echo. Blood cx taken 4/18 ngtd  Height: 5\' 1"  (154.9 cm) Weight: 204 lb 1.6 oz (92.579 kg) IBW/kg (Calculated) : 47.8  Temp (24hrs), Avg:98.3 F (36.8 C), Min:98 F (36.7 C), Max:98.5 F (36.9 C)   Recent Labs Lab 04/02/16 1916 04/03/16 0158 04/04/16 0509 04/04/16 2206 04/06/16 0352  WBC 27.6* 25.0* 22.2* 25.0*  --   CREATININE 3.66* 3.89* 3.16* 2.63* 1.64*  LATICACIDVEN 1.2 1.1  --   --   --     Estimated Creatinine Clearance: 37.4 mL/min (by C-G formula based on Cr of 1.64).    Allergies  Allergen Reactions  . Compazine [Prochlorperazine Edisylate] Nausea And Vomiting    Antimicrobials this admission: ceftriaxone 4/16 >> 4/17 cefazolin 4/17 >>   Dose adjustments this admission:   Microbiology results: Recent Results (from the past 240 hour(s))  Culture, blood (routine x 2)     Status: Abnormal   Collection Time: 04/02/16  7:16 PM  Result Value Ref Range Status   Specimen Description BLOOD LEFT ASSIST CONTROL  Final   Special Requests BOTTLES DRAWN AEROBIC AND ANAEROBIC 1CCAERO,1CCANA  Final   Culture  Setup Time   Final    GRAM POSITIVE COCCI IN BOTH AEROBIC AND ANAEROBIC BOTTLES CRITICAL VALUE NOTED.  VALUE IS CONSISTENT WITH PREVIOUSLY REPORTED AND CALLED VALUE.    Culture (A)  Final    STAPHYLOCOCCUS AUREUS IN BOTH AEROBIC AND ANAEROBIC BOTTLES    Report Status 04/05/2016 FINAL  Final   Organism ID, Bacteria STAPHYLOCOCCUS AUREUS  Final      Susceptibility   Staphylococcus aureus - MIC*    CIPROFLOXACIN <=0.5 SENSITIVE Sensitive     ERYTHROMYCIN <=0.25 SENSITIVE Sensitive     GENTAMICIN <=0.5 SENSITIVE Sensitive     OXACILLIN  0.5 SENSITIVE Sensitive     TETRACYCLINE 2 SENSITIVE Sensitive     VANCOMYCIN 1 SENSITIVE Sensitive     TRIMETH/SULFA <=10 SENSITIVE Sensitive     CLINDAMYCIN <=0.25 SENSITIVE Sensitive     RIFAMPIN <=0.5 SENSITIVE Sensitive     Inducible Clindamycin NEGATIVE Sensitive     * STAPHYLOCOCCUS AUREUS  Culture, blood (routine x 2)     Status: Abnormal   Collection Time: 04/02/16  7:16 PM  Result Value Ref Range Status   Specimen Description BLOOD RIGHT HAND  Final   Special Requests BOTTLES DRAWN AEROBIC AND ANAEROBIC 1CCAERO,1CCANA  Final   Culture  Setup Time   Final    GRAM POSITIVE COCCI IN BOTH AEROBIC AND ANAEROBIC BOTTLES CRITICAL RESULT CALLED TO, READ BACK BY AND VERIFIED WITH: KAREN HAYES AT 1338 ON 04/03/16 BY CTJ.    Culture (A)  Final    STAPHYLOCOCCUS AUREUS IN BOTH AEROBIC AND ANAEROBIC BOTTLES    Report Status 04/05/2016 FINAL  Final   Organism ID, Bacteria STAPHYLOCOCCUS AUREUS  Final      Susceptibility   Staphylococcus aureus - MIC*    CIPROFLOXACIN <=0.5 SENSITIVE Sensitive     ERYTHROMYCIN <=0.25 SENSITIVE Sensitive     GENTAMICIN <=0.5 SENSITIVE Sensitive     OXACILLIN 0.5 SENSITIVE Sensitive     TETRACYCLINE 2 SENSITIVE Sensitive     VANCOMYCIN 1  SENSITIVE Sensitive     TRIMETH/SULFA <=10 SENSITIVE Sensitive     CLINDAMYCIN <=0.25 SENSITIVE Sensitive     RIFAMPIN <=0.5 SENSITIVE Sensitive     Inducible Clindamycin NEGATIVE Sensitive     * STAPHYLOCOCCUS AUREUS  Urine culture     Status: None   Collection Time: 04/02/16  7:16 PM  Result Value Ref Range Status   Specimen Description URINE, RANDOM  Final   Special Requests NONE  Final   Culture MULTIPLE SPECIES PRESENT, SUGGEST RECOLLECTION  Final   Report Status 04/04/2016 FINAL  Final  Blood Culture ID Panel (Reflexed)     Status: Abnormal   Collection Time: 04/02/16  7:16 PM  Result Value Ref Range Status   Enterococcus species NOT DETECTED NOT DETECTED Final   Vancomycin resistance NOT DETECTED NOT  DETECTED Final   Listeria monocytogenes NOT DETECTED NOT DETECTED Final   Staphylococcus species DETECTED (A) NOT DETECTED Final    Comment: CRITICAL RESULT CALLED TO, READ BACK BY AND VERIFIED WITH: KAREN HAYES FOR STAPHYLOCOCCUS SPECIES AND STAPHYLOCOCCUS AUREUS AT 1338 ON 04/03/16 BY CTJ.    Staphylococcus aureus DETECTED (A) NOT DETECTED Final   Methicillin resistance NOT DETECTED NOT DETECTED Final   Streptococcus species NOT DETECTED NOT DETECTED Final   Streptococcus agalactiae NOT DETECTED NOT DETECTED Final   Streptococcus pneumoniae NOT DETECTED NOT DETECTED Final   Streptococcus pyogenes NOT DETECTED NOT DETECTED Final   Acinetobacter baumannii NOT DETECTED NOT DETECTED Final   Enterobacteriaceae species NOT DETECTED NOT DETECTED Final   Enterobacter cloacae complex NOT DETECTED NOT DETECTED Final   Escherichia coli NOT DETECTED NOT DETECTED Final   Klebsiella oxytoca NOT DETECTED NOT DETECTED Final   Klebsiella pneumoniae NOT DETECTED NOT DETECTED Final   Proteus species NOT DETECTED NOT DETECTED Final   Serratia marcescens NOT DETECTED NOT DETECTED Final   Carbapenem resistance NOT DETECTED NOT DETECTED Final   Haemophilus influenzae NOT DETECTED NOT DETECTED Final   Neisseria meningitidis NOT DETECTED NOT DETECTED Final   Pseudomonas aeruginosa NOT DETECTED NOT DETECTED Final   Candida albicans NOT DETECTED NOT DETECTED Final   Candida glabrata NOT DETECTED NOT DETECTED Final   Candida krusei NOT DETECTED NOT DETECTED Final   Candida parapsilosis NOT DETECTED NOT DETECTED Final   Candida tropicalis NOT DETECTED NOT DETECTED Final  Urine culture     Status: None   Collection Time: 04/04/16  4:44 PM  Result Value Ref Range Status   Specimen Description URINE, RANDOM  Final   Special Requests NONE  Final   Culture NO GROWTH 2 DAYS  Final   Report Status 04/06/2016 FINAL  Final  Culture, blood (Routine X 2) w Reflex to ID Panel     Status: None (Preliminary result)    Collection Time: 04/04/16 10:06 PM  Result Value Ref Range Status   Specimen Description BLOOD LEFT ANTECUBITAL  Final   Special Requests BOTTLES DRAWN AEROBIC AND ANAEROBIC  Final   Culture NO GROWTH 2 DAYS  Final   Report Status PENDING  Incomplete     Thank you for allowing pharmacy to be a part of this patient's care.  Crist Fat, PharmD, BCPS Clinical Pharmacist 04/06/2016 11:45 AM

## 2016-04-06 NOTE — Progress Notes (Signed)
[facility]  ROUNDING NOTE   Subjective:   Complains of pain.  Creatinine improving.   Objective:  Vital signs in last 24 hours:  Temp:  [98 F (36.7 C)-98.5 F (36.9 C)] 98.5 F (36.9 C) (04/20 1009) Pulse Rate:  [69-90] 87 (04/20 1009) Resp:  [17-22] 22 (04/20 1009) BP: (120-171)/(62-74) 165/64 mmHg (04/20 1009) SpO2:  [96 %-98 %] 96 % (04/20 1009)  Weight change:  Filed Weights   04/02/16 1914 04/03/16 0139  Weight: 87.544 kg (193 lb) 92.579 kg (204 lb 1.6 oz)    Intake/Output: I/O last 3 completed shifts: In: 783 [P.O.:480; IV Piggyback:303] Out: 3200 [Urine:3200]   Intake/Output this shift:  Total I/O In: -  Out: 350 [Urine:350]  Physical Exam: General: NAD  Head: Normocephalic, atraumatic. Moist oral mucosal membranes  Eyes: Anicteric, PERRL  Neck: Supple, trachea midline  Lungs:  Bilateral wheezing  Heart: Regular rate and rhythm  Abdomen:  Soft, nontender,   Extremities: trace peripheral edema. Right wrist cellulitis  Neurologic: Nonfocal, moving all four extremities  Skin: +erythema       Basic Metabolic Panel:  Recent Labs Lab 04/02/16 1916 04/03/16 0158 04/04/16 0509 04/04/16 2206 04/06/16 0352  NA 134* 135 134* 134* 141  K 3.9 3.3* 4.0 4.0 4.1  CL 102 103 108 105 112*  CO2 18* 20* 16* 15* 21*  GLUCOSE 120* 109* 87 66 89  BUN 69* 70* 69* 62* 54*  CREATININE 3.66* 3.89* 3.16* 2.63* 1.64*  CALCIUM 8.2* 7.9* 8.3* 8.4* 9.0  PHOS  --   --  5.6* 4.9* 4.2    Liver Function Tests:  Recent Labs Lab 04/02/16 1916 04/04/16 0509 04/04/16 2206 04/06/16 0352  AST 27  --   --   --   ALT 40  --   --   --   ALKPHOS 164*  --   --   --   BILITOT 0.2*  --   --   --   PROT 7.2  --   --   --   ALBUMIN 3.2* 2.5* 2.4* 2.4*   No results for input(s): LIPASE, AMYLASE in the last 168 hours. No results for input(s): AMMONIA in the last 168 hours.  CBC:  Recent Labs Lab 04/02/16 1916 04/03/16 0158 04/04/16 0509 04/04/16 2206   WBC 27.6* 25.0* 22.2* 25.0*  NEUTROABS 25.8*  --   --   --   HGB 10.5* 9.2* 9.8* 11.5*  HCT 32.0* 27.6* 30.5* 34.5*  MCV 87.2 88.3 89.3 87.4  PLT 234 214 252 266    Cardiac Enzymes:  Recent Labs Lab 04/02/16 1916  TROPONINI 0.06*    BNP: Invalid input(s): POCBNP  CBG: No results for input(s): GLUCAP in the last 168 hours.  Microbiology: Results for orders placed or performed during the hospital encounter of 04/02/16  Culture, blood (routine x 2)     Status: Abnormal   Collection Time: 04/02/16  7:16 PM  Result Value Ref Range Status   Specimen Description BLOOD LEFT ASSIST CONTROL  Final   Special Requests BOTTLES DRAWN AEROBIC AND ANAEROBIC 1CCAERO,1CCANA  Final   Culture  Setup Time   Final    GRAM POSITIVE COCCI IN BOTH AEROBIC AND ANAEROBIC BOTTLES CRITICAL VALUE NOTED.  VALUE IS CONSISTENT WITH PREVIOUSLY REPORTED AND CALLED VALUE.    Culture (A)  Final    STAPHYLOCOCCUS AUREUS IN BOTH AEROBIC AND ANAEROBIC BOTTLES    Report Status 04/05/2016 FINAL  Final   Organism ID, Bacteria STAPHYLOCOCCUS AUREUS  Final      Susceptibility   Staphylococcus aureus - MIC*    CIPROFLOXACIN <=0.5 SENSITIVE Sensitive     ERYTHROMYCIN <=0.25 SENSITIVE Sensitive     GENTAMICIN <=0.5 SENSITIVE Sensitive     OXACILLIN 0.5 SENSITIVE Sensitive     TETRACYCLINE 2 SENSITIVE Sensitive     VANCOMYCIN 1 SENSITIVE Sensitive     TRIMETH/SULFA <=10 SENSITIVE Sensitive     CLINDAMYCIN <=0.25 SENSITIVE Sensitive     RIFAMPIN <=0.5 SENSITIVE Sensitive     Inducible Clindamycin NEGATIVE Sensitive     * STAPHYLOCOCCUS AUREUS  Culture, blood (routine x 2)     Status: Abnormal   Collection Time: 04/02/16  7:16 PM  Result Value Ref Range Status   Specimen Description BLOOD RIGHT HAND  Final   Special Requests BOTTLES DRAWN AEROBIC AND ANAEROBIC 1CCAERO,1CCANA  Final   Culture  Setup Time   Final    GRAM POSITIVE COCCI IN BOTH AEROBIC AND ANAEROBIC BOTTLES CRITICAL RESULT CALLED TO, READ  BACK BY AND VERIFIED WITH: KAREN HAYES AT 1338 ON 04/03/16 BY CTJ.    Culture (A)  Final    STAPHYLOCOCCUS AUREUS IN BOTH AEROBIC AND ANAEROBIC BOTTLES    Report Status 04/05/2016 FINAL  Final   Organism ID, Bacteria STAPHYLOCOCCUS AUREUS  Final      Susceptibility   Staphylococcus aureus - MIC*    CIPROFLOXACIN <=0.5 SENSITIVE Sensitive     ERYTHROMYCIN <=0.25 SENSITIVE Sensitive     GENTAMICIN <=0.5 SENSITIVE Sensitive     OXACILLIN 0.5 SENSITIVE Sensitive     TETRACYCLINE 2 SENSITIVE Sensitive     VANCOMYCIN 1 SENSITIVE Sensitive     TRIMETH/SULFA <=10 SENSITIVE Sensitive     CLINDAMYCIN <=0.25 SENSITIVE Sensitive     RIFAMPIN <=0.5 SENSITIVE Sensitive     Inducible Clindamycin NEGATIVE Sensitive     * STAPHYLOCOCCUS AUREUS  Urine culture     Status: None   Collection Time: 04/02/16  7:16 PM  Result Value Ref Range Status   Specimen Description URINE, RANDOM  Final   Special Requests NONE  Final   Culture MULTIPLE SPECIES PRESENT, SUGGEST RECOLLECTION  Final   Report Status 04/04/2016 FINAL  Final  Blood Culture ID Panel (Reflexed)     Status: Abnormal   Collection Time: 04/02/16  7:16 PM  Result Value Ref Range Status   Enterococcus species NOT DETECTED NOT DETECTED Final   Vancomycin resistance NOT DETECTED NOT DETECTED Final   Listeria monocytogenes NOT DETECTED NOT DETECTED Final   Staphylococcus species DETECTED (A) NOT DETECTED Final    Comment: CRITICAL RESULT CALLED TO, READ BACK BY AND VERIFIED WITH: KAREN HAYES FOR STAPHYLOCOCCUS SPECIES AND STAPHYLOCOCCUS AUREUS AT 1338 ON 04/03/16 BY CTJ.    Staphylococcus aureus DETECTED (A) NOT DETECTED Final   Methicillin resistance NOT DETECTED NOT DETECTED Final   Streptococcus species NOT DETECTED NOT DETECTED Final   Streptococcus agalactiae NOT DETECTED NOT DETECTED Final   Streptococcus pneumoniae NOT DETECTED NOT DETECTED Final   Streptococcus pyogenes NOT DETECTED NOT DETECTED Final   Acinetobacter baumannii NOT  DETECTED NOT DETECTED Final   Enterobacteriaceae species NOT DETECTED NOT DETECTED Final   Enterobacter cloacae complex NOT DETECTED NOT DETECTED Final   Escherichia coli NOT DETECTED NOT DETECTED Final   Klebsiella oxytoca NOT DETECTED NOT DETECTED Final   Klebsiella pneumoniae NOT DETECTED NOT DETECTED Final   Proteus species NOT DETECTED NOT DETECTED Final   Serratia marcescens NOT DETECTED NOT DETECTED Final   Carbapenem resistance NOT  DETECTED NOT DETECTED Final   Haemophilus influenzae NOT DETECTED NOT DETECTED Final   Neisseria meningitidis NOT DETECTED NOT DETECTED Final   Pseudomonas aeruginosa NOT DETECTED NOT DETECTED Final   Candida albicans NOT DETECTED NOT DETECTED Final   Candida glabrata NOT DETECTED NOT DETECTED Final   Candida krusei NOT DETECTED NOT DETECTED Final   Candida parapsilosis NOT DETECTED NOT DETECTED Final   Candida tropicalis NOT DETECTED NOT DETECTED Final  Urine culture     Status: None   Collection Time: 04/04/16  4:44 PM  Result Value Ref Range Status   Specimen Description URINE, RANDOM  Final   Special Requests NONE  Final   Culture NO GROWTH 2 DAYS  Final   Report Status 04/06/2016 FINAL  Final  Culture, blood (Routine X 2) w Reflex to ID Panel     Status: None (Preliminary result)   Collection Time: 04/04/16 10:06 PM  Result Value Ref Range Status   Specimen Description BLOOD LEFT ANTECUBITAL  Final   Special Requests BOTTLES DRAWN AEROBIC AND ANAEROBIC  Final   Culture NO GROWTH 2 DAYS  Final   Report Status PENDING  Incomplete    Coagulation Studies: No results for input(s): LABPROT, INR in the last 72 hours.  Urinalysis: No results for input(s): COLORURINE, LABSPEC, PHURINE, GLUCOSEU, HGBUR, BILIRUBINUR, KETONESUR, PROTEINUR, UROBILINOGEN, NITRITE, LEUKOCYTESUR in the last 72 hours.  Invalid input(s): APPERANCEUR    Imaging: No results found.   Medications:     .  ceFAZolin (ANCEF) IV  2 g Intravenous Q12H  . guaiFENesin   600 mg Oral BID  . heparin  5,000 Units Subcutaneous 3 times per day  . pantoprazole  40 mg Oral QAC breakfast  . senna-docusate  1 tablet Oral BID  . sodium chloride  500 mL Intravenous Once  . tiZANidine  4 mg Oral QHS  . venlafaxine XR  37.5 mg Oral Q breakfast   acetaminophen **OR** acetaminophen, albuterol, ALPRAZolam, diphenhydrAMINE, ondansetron **OR** ondansetron (ZOFRAN) IV  Assessment/ Plan:  [name] is a 60 y.o. white female with GERD, osteoarthritis and hyperlipidemia, who was admitted to [facility] on 04/02/2016   1. Acute renal failure with proteinuria and hematuria: baseline creatinine of 0.69 from 4/12. On meloxicam at home (for many months according patient). Seems to have been given toradol at [facility] ED last week.  . Continue to monitor volume status and urine output.  - serologies negative - No acute indication for dialysis. Renally dose all medications. Holding nephrotoxic agents including meloxicam and other NSAIDs.   2. Urinary Tract Infection: patient was asymptomatic with dysuria, fevers and chills on admission. However now with septic arthritis of right wrist. Appreciate ID input. MRI for later today - cefazolin    LOS: 3 Danielle [name] 4/20/201710:57 AM

## 2016-04-06 NOTE — Progress Notes (Signed)
[facility] [place] [facility]   PATIENT NAME: Danielle [name]    MR#:  [id number]  DATE OF BIRTH:  [birth year]  SUBJECTIVE:   c/o weakness and generalized aches and pains. Cough+  REVIEW OF SYSTEMS:  CONSTITUTIONAL: No fever, fatigue , positive weakness.  EYES: No blurred or double vision.  EARS, NOSE, AND THROAT: No tinnitus or ear pain.  RESPIRATORY: No cough, shortness of breath, wheezing or hemoptysis.  CARDIOVASCULAR: No chest pain, orthopnea, edema.  GASTROINTESTINAL: No nausea, vomiting, diarrhea or abdominal pain.  GENITOURINARY: No dysuria, hematuria.  ENDOCRINE: No polyuria, nocturia,  HEMATOLOGY: No anemia, easy bruising or bleeding SKIN: right UE redness MUSCULOSKELETAL: Has chronic arthritis. Currently right hand and wrist joints are swollen  NEUROLOGIC: No tingling, numbness, weakness.  PSYCHIATRY: No anxiety or depression.   DRUG ALLERGIES:   Allergies  Allergen Reactions  . Compazine [Prochlorperazine Edisylate] Nausea And Vomiting    VITALS:  Blood pressure 156/61, pulse 84, temperature 98.6 F (37 C), temperature source Oral, resp. rate 18, height  (1.549 m), weight 92.579 kg (204 lb 1.6 oz), SpO2 98 %.  PHYSICAL EXAMINATION:  GENERAL:  60 y.o.-year-old patient lying in the bed with no acute distress.  EYES: Pupils equal, round, reactive to light and accommodation. No scleral icterus. Extraocular muscles intact.  HEENT: Head atraumatic, normocephalic. Oropharynx and nasopharynx clear.  NECK:  Supple, no jugular venous distention. No thyroid enlargement, no tenderness.  LUNGS: Normal breath sounds bilaterally, no wheezing, rales,rhonchi or crepitation. No use of accessory muscles of respiration.  CARDIOVASCULAR: S1, S2 normal. No murmurs, rubs, or gallops.  ABDOMEN: Soft, nontender, nondistended. Bowel sounds present. No organomegaly or mass.  EXTREMITIES: Right wrist joint and dorsal aspect of the hand are edematous and  tender with decreased range of motion, radial pulse is 1-2+ .No pedal edema, cyanosis, or clubbing.  NEUROLOGIC: Cranial nerves II through XII are intact. Muscle strength 5/5 in all extremities. Sensation intact. Gait not checked.  PSYCHIATRIC: The patient is alert and oriented x 3.  SKIN: No obvious rash, lesion, or ulcer.    LABORATORY PANEL:   CBC  Recent Labs Lab 04/04/16 2206  WBC 25.0*  HGB 11.5*  HCT 34.5*  PLT 266   ------------------------------------------------------------------------------------------------------------------  Chemistries   Recent Labs Lab 04/02/16 1916  04/06/16 0352  NA 134*  < > 141  K 3.9  < > 4.1  CL 102  < > 112*  CO2 18*  < > 21*  GLUCOSE 120*  < > 89  BUN 69*  < > 54*  CREATININE 3.66*  < > 1.64*  CALCIUM 8.2*  < > 9.0  AST 27  --   --   ALT 40  --   --   ALKPHOS 164*  --   --   BILITOT 0.2*  --   --   < > = values in this interval not displayed. ------------------------------------------------------------------------------------------------------------------  Cardiac Enzymes  Recent Labs Lab 04/02/16 1916  TROPONINI 0.06*   ------------------------------------------------------------------------------------------------------------------  RADIOLOGY:  Mr Lumbar Spine Wo Contrast  04/06/2016  CLINICAL DATA:  Low back pain. Prior MRI from [facility] dated 03/29/2016 not available for comparison was negative for discitis. EXAM: MRI LUMBAR SPINE WITHOUT CONTRAST TECHNIQUE: Multiplanar, multisequence MR imaging of the lumbar spine was performed. No intravenous contrast was administered. COMPARISON:  None. FINDINGS: Segmentation: Conventional anatomy assistant, with the last open disc space designated L5-S1. Alignment: 3 mm of anterolisthesis of L4 on L5 secondary to facet arthropathy. Bones:  The vertebral bodies of the lumbar spine are normal in size. Bilateral L5 pars interarticularis defects. Partially visualized is mild marrow edema in the  sacrum bilaterally with a subtle linear component most concerning for bilateral sacral insufficiency fractures. There is otherwise normal bone marrow signal demonstrated throughout the vertebra. The visualized portions of the SI joints are unremarkable. Conus medullaris: Extends to the T12-L1 level and appears normal. The nerve roots of the cauda equina and the filum terminale are normal. Paraspinal and other soft tissues: Mild paraspinal soft tissue edema. Small amount of fluid between the spinous processes at L2-3. Disc levels: Disc spaces: Mild degenerative disc disease with disc desiccation at L4-5. T12-L1: No significant disc bulge. No evidence of neural foraminal stenosis. No central canal stenosis. L1-L2: No significant disc bulge. No evidence of neural foraminal stenosis. No central canal stenosis. L2-L3: No significant disc bulge. No evidence of neural foraminal stenosis. No central canal stenosis. L3-L4: No significant disc bulge. No evidence of neural foraminal stenosis. No central canal stenosis. Mild bilateral facet arthropathy. L4-L5: Mild broad-based disc bulge. Severe bilateral facet arthropathy. No evidence of neural foraminal stenosis. No central canal stenosis. L5-S1: No significant disc bulge. No evidence of neural foraminal stenosis. No central canal stenosis. Mild bilateral facet arthropathy. IMPRESSION: 1. Bilateral sacral insufficiency fractures partially visualized. Bone marrow edema partially visualized in the sacral ala bilaterally. 2. No evidence of discitis/osteomyelitis. 3. At L4-5 there is a mild broad-based disc bulge. Severe bilateral facet arthropathy. 4. Mild paraspinal soft tissue edema along the posterior elements which may be reactive versus secondary to muscle strain versus less likely infection. Electronically Signed   By: Elige KoHetal  Edin Skarda   On: 04/06/2016 11:01   ASSESSMENT AND PLAN:   60 year old female patient with history of osteoarthritis, GERD, back pain presented to the  emergency room with generalized weakness and transient disorientation.  # Acute kidney injury probably from dehydration from poor by mouth intake and taking meloxicam for back pain (recently prescribed) continue IV fluids and monitor renal function closely. Avoid nephrotoxins, d/c foley - nephrology consult appreciated Creat 3.69--3.89--3.16--2.63--1.63 -IVF d/ced by nephrology  #Right hand swelling/cellulitis with MSSA sepsis -Hand x-ray with no acute findings -No evidence of abscess per Dr.Menz -IV cefazolin per ID -redness improved. No s/o abscess -MRI lumbar spine no evidence of discitis  -TTE negative--->will get cardiology to do TEE. -spoke with dr Lady Garyfath  #. Urinary tract infection -UC mixed bacterial contamination  #. Leukocytosis which could be secondary to sepsis 27K--25k--22.2K--25K--cbc today pending  # Osteoarthritis Pain medications as needed  -MRI lumbar spine ??sacral insuff fracture  #. GERD-PPI   #DVT prophylaxis with subcutaneous heparin  #D/c planning to rehab All the records are reviewed and case discussed with Care Management/Social Workerr. Management plans discussed with the patient, and sister and they are in agreement.  CODE STATUS: full code  TOTAL TIME TAKING CARE OF THIS PATIENT: 25 minutes.   POSSIBLE D/C IN 2-3 DAYS, DEPENDING ON CLINICAL CONDITION.   Judith Campillo M.D on 04/06/2016 at 5:55 PM  Between 7am to 6pm - Pager - [phone] After 6pm go to [email or web address] - password [email or web address] [facility]  [facility]  Office  [phone]  CC: Primary care physician; No primary care provider on file.

## 2016-04-06 NOTE — Clinical Social Work Note (Signed)
CSW has extended bed offers to patient and she has chosen [facility]. She informed and CSW confirmed that she was going to need 2 weeks of IV ABX. CSW has informed Selena BattenKim at [facility] of this and accepted the bed offer from [facility] on epic. York SpanielMonica Derrell Milanes MSW,LCSW [phone]

## 2016-04-07 ENCOUNTER — Encounter: Payer: Self-pay | Admitting: Cardiology

## 2016-04-07 ENCOUNTER — Inpatient Hospital Stay
Admit: 2016-04-07 | Discharge: 2016-04-07 | Disposition: A | Discharge status: Home / Self Care | Payer: Commercial Managed Care - PPO | Attending: Internal Medicine | Admitting: Internal Medicine

## 2016-04-07 ENCOUNTER — Inpatient Hospital Stay: Admit: 2016-04-07 | Payer: Commercial Managed Care - PPO

## 2016-04-07 ENCOUNTER — Encounter
Admission: EM | Disposition: A | Discharge status: Skilled Nursing Facility | Payer: Self-pay | Source: Home / Self Care | Attending: Internal Medicine

## 2016-04-07 HISTORY — PX: TEE WITHOUT CARDIOVERSION: SHX5443

## 2016-04-07 LAB — RENAL FUNCTION PANEL
ALBUMIN: 2.3 g/dL — AB (ref 3.5–5.0)
Anion gap: 7 (ref 5–15)
BUN: 35 mg/dL — ABNORMAL HIGH (ref 6–20)
CALCIUM: 8.6 mg/dL — AB (ref 8.9–10.3)
CO2: 23 mmol/L (ref 22–32)
CREATININE: 1.02 mg/dL — AB (ref 0.44–1.00)
Chloride: 109 mmol/L (ref 101–111)
GFR calc non Af Amer: 58 mL/min — ABNORMAL LOW (ref >60)
GLUCOSE: 79 mg/dL (ref 65–99)
PHOSPHORUS: 3 mg/dL (ref 2.5–4.6)
Potassium: 3.9 mmol/L (ref 3.5–5.1)
SODIUM: 139 mmol/L (ref 135–145)

## 2016-04-07 LAB — CBC
HCT: 34.5 % — ABNORMAL LOW (ref 35.0–47.0)
HEMOGLOBIN: 11.2 g/dL — AB (ref 12.0–16.0)
MCH: 28.6 pg (ref 26.0–34.0)
MCHC: 32.6 g/dL (ref 32.0–36.0)
MCV: 87.9 fL (ref 80.0–100.0)
PLATELETS: 292 10*3/uL (ref 150–440)
RBC: 3.93 MIL/uL (ref 3.80–5.20)
RDW: 15.7 % — ABNORMAL HIGH (ref 11.5–14.5)
WBC: 25 10*3/uL — ABNORMAL HIGH (ref 3.6–11.0)

## 2016-04-07 SURGERY — ECHOCARDIOGRAM, TRANSESOPHAGEAL
Anesthesia: Moderate Sedation

## 2016-04-07 MED ORDER — FENTANYL CITRATE (PF) 100 MCG/2ML IJ SOLN
INTRAMUSCULAR | Status: AC
  Filled 2016-04-07: qty 4

## 2016-04-07 MED ORDER — MIDAZOLAM HCL 2 MG/2ML IJ SOLN
INTRAMUSCULAR | Status: AC
  Filled 2016-04-07: qty 4

## 2016-04-07 MED ORDER — SODIUM CHLORIDE 0.9 % IV SOLN
INTRAVENOUS | Status: DC
  Administered 2016-04-07: 08:00:00 via INTRAVENOUS

## 2016-04-07 MED ORDER — LIDOCAINE VISCOUS 2 % MT SOLN
OROMUCOSAL | Status: AC
  Filled 2016-04-07: qty 15

## 2016-04-07 MED ORDER — MIDAZOLAM HCL 2 MG/2ML IJ SOLN
INTRAMUSCULAR | Status: DC | PRN
  Administered 2016-04-07: 2 mg via INTRAVENOUS

## 2016-04-07 MED ORDER — FENTANYL CITRATE (PF) 100 MCG/2ML IJ SOLN
INTRAMUSCULAR | Status: DC | PRN
  Administered 2016-04-07: 50 ug via INTRAVENOUS

## 2016-04-07 MED ORDER — POLYETHYLENE GLYCOL 3350 17 G PO PACK
17.0000 g | PACK | ORAL | Status: DC
  Administered 2016-04-08: 17 g via ORAL
  Filled 2016-04-07: qty 1

## 2016-04-07 MED ORDER — BUTAMBEN-TETRACAINE-BENZOCAINE 2-2-14 % EX AERO
INHALATION_SPRAY | CUTANEOUS | Status: AC
  Filled 2016-04-07: qty 20

## 2016-04-07 NOTE — Progress Notes (Signed)
[facility]  ROUNDING NOTE   Subjective:   TEE this morning. Creatinine improved. Pain somewhat better controlled.   Objective:  Vital signs in last 24 hours:  Temp:  [97.5 F (36.4 C)-98.6 F (37 C)] 98.3 F (36.8 C) (04/21 1000) Pulse Rate:  [71-91] 82 (04/21 1000) Resp:  [16-20] 18 (04/21 1000) BP: (129-158)/(61-95) 153/73 mmHg (04/21 1000) SpO2:  [93 %-100 %] 96 % (04/21 1000)  Weight change:  Filed Weights   04/02/16 1914 04/03/16 0139  Weight: 87.544 kg (193 lb) 92.579 kg (204 lb 1.6 oz)    Intake/Output: I/O last 3 completed shifts: In: 1083 [P.O.:780; IV Piggyback:303] Out: 2625 [Urine:2625]   Intake/Output this shift:  Total I/O In: -  Out: 250 [Urine:250]  Physical Exam: General: NAD  Head: Normocephalic, atraumatic. Moist oral mucosal membranes  Eyes: Anicteric, PERRL  Neck: Supple, trachea midline  Lungs:  Bilateral wheezing  Heart: Regular rate and rhythm  Abdomen:  Soft, nontender,   Extremities: trace peripheral edema. Right wrist cellulitis  Neurologic: Nonfocal, moving all four extremities  Skin: +erythema       Basic Metabolic Panel:  Recent Labs Lab 04/03/16 0158 04/04/16 0509 04/04/16 2206 04/06/16 0352 04/07/16 1032  NA 135 134* 134* 141 139  K 3.3* 4.0 4.0 4.1 3.9  CL 103 108 105 112* 109  CO2 20* 16* 15* 21* 23  GLUCOSE 109* 87 66 89 79  BUN 70* 69* 62* 54* 35*  CREATININE 3.89* 3.16* 2.63* 1.64* 1.02*  CALCIUM 7.9* 8.3* 8.4* 9.0 8.6*  PHOS  --  5.6* 4.9* 4.2 3.0    Liver Function Tests:  Recent Labs Lab 04/02/16 1916 04/04/16 0509 04/04/16 2206 04/06/16 0352 04/07/16 1032  AST 27  --   --   --   --   ALT 40  --   --   --   --   ALKPHOS 164*  --   --   --   --   BILITOT 0.2*  --   --   --   --   PROT 7.2  --   --   --   --   ALBUMIN 3.2* 2.5* 2.4* 2.4* 2.3*   No results for input(s): LIPASE, AMYLASE in the last 168 hours. No results for input(s): AMMONIA in the last 168 hours.  CBC:  Recent  Labs Lab 04/02/16 1916 04/03/16 0158 04/04/16 0509 04/04/16 2206 04/07/16 0435  WBC 27.6* 25.0* 22.2* 25.0* 25.0*  NEUTROABS 25.8*  --   --   --   --   HGB 10.5* 9.2* 9.8* 11.5* 11.2*  HCT 32.0* 27.6* 30.5* 34.5* 34.5*  MCV 87.2 88.3 89.3 87.4 87.9  PLT 234 214 252 266 292    Cardiac Enzymes:  Recent Labs Lab 04/02/16 1916  TROPONINI 0.06*    BNP: Invalid input(s): POCBNP  CBG: No results for input(s): GLUCAP in the last 168 hours.  Microbiology: Results for orders placed or performed during the hospital encounter of 04/02/16  Culture, blood (routine x 2)     Status: Abnormal   Collection Time: 04/02/16  7:16 PM  Result Value Ref Range Status   Specimen Description BLOOD LEFT ASSIST CONTROL  Final   Special Requests BOTTLES DRAWN AEROBIC AND ANAEROBIC 1CCAERO,1CCANA  Final   Culture  Setup Time   Final    GRAM POSITIVE COCCI IN BOTH AEROBIC AND ANAEROBIC BOTTLES CRITICAL VALUE NOTED.  VALUE IS CONSISTENT WITH PREVIOUSLY REPORTED AND CALLED VALUE.    Culture (A)  Final    STAPHYLOCOCCUS AUREUS IN BOTH AEROBIC AND ANAEROBIC BOTTLES    Report Status 04/05/2016 FINAL  Final   Organism ID, Bacteria STAPHYLOCOCCUS AUREUS  Final      Susceptibility   Staphylococcus aureus - MIC*    CIPROFLOXACIN <=0.5 SENSITIVE Sensitive     ERYTHROMYCIN <=0.25 SENSITIVE Sensitive     GENTAMICIN <=0.5 SENSITIVE Sensitive     OXACILLIN 0.5 SENSITIVE Sensitive     TETRACYCLINE 2 SENSITIVE Sensitive     VANCOMYCIN 1 SENSITIVE Sensitive     TRIMETH/SULFA <=10 SENSITIVE Sensitive     CLINDAMYCIN <=0.25 SENSITIVE Sensitive     RIFAMPIN <=0.5 SENSITIVE Sensitive     Inducible Clindamycin NEGATIVE Sensitive     * STAPHYLOCOCCUS AUREUS  Culture, blood (routine x 2)     Status: Abnormal   Collection Time: 04/02/16  7:16 PM  Result Value Ref Range Status   Specimen Description BLOOD RIGHT HAND  Final   Special Requests BOTTLES DRAWN AEROBIC AND ANAEROBIC 1CCAERO,1CCANA  Final   Culture   Setup Time   Final    GRAM POSITIVE COCCI IN BOTH AEROBIC AND ANAEROBIC BOTTLES CRITICAL RESULT CALLED TO, READ BACK BY AND VERIFIED WITH: KAREN HAYES AT 1338 ON 04/03/16 BY CTJ.    Culture (A)  Final    STAPHYLOCOCCUS AUREUS IN BOTH AEROBIC AND ANAEROBIC BOTTLES    Report Status 04/05/2016 FINAL  Final   Organism ID, Bacteria STAPHYLOCOCCUS AUREUS  Final      Susceptibility   Staphylococcus aureus - MIC*    CIPROFLOXACIN <=0.5 SENSITIVE Sensitive     ERYTHROMYCIN <=0.25 SENSITIVE Sensitive     GENTAMICIN <=0.5 SENSITIVE Sensitive     OXACILLIN 0.5 SENSITIVE Sensitive     TETRACYCLINE 2 SENSITIVE Sensitive     VANCOMYCIN 1 SENSITIVE Sensitive     TRIMETH/SULFA <=10 SENSITIVE Sensitive     CLINDAMYCIN <=0.25 SENSITIVE Sensitive     RIFAMPIN <=0.5 SENSITIVE Sensitive     Inducible Clindamycin NEGATIVE Sensitive     * STAPHYLOCOCCUS AUREUS  Urine culture     Status: None   Collection Time: 04/02/16  7:16 PM  Result Value Ref Range Status   Specimen Description URINE, RANDOM  Final   Special Requests NONE  Final   Culture MULTIPLE SPECIES PRESENT, SUGGEST RECOLLECTION  Final   Report Status 04/04/2016 FINAL  Final  Blood Culture ID Panel (Reflexed)     Status: Abnormal   Collection Time: 04/02/16  7:16 PM  Result Value Ref Range Status   Enterococcus species NOT DETECTED NOT DETECTED Final   Vancomycin resistance NOT DETECTED NOT DETECTED Final   Listeria monocytogenes NOT DETECTED NOT DETECTED Final   Staphylococcus species DETECTED (A) NOT DETECTED Final    Comment: CRITICAL RESULT CALLED TO, READ BACK BY AND VERIFIED WITH: KAREN HAYES FOR STAPHYLOCOCCUS SPECIES AND STAPHYLOCOCCUS AUREUS AT 1338 ON 04/03/16 BY CTJ.    Staphylococcus aureus DETECTED (A) NOT DETECTED Final   Methicillin resistance NOT DETECTED NOT DETECTED Final   Streptococcus species NOT DETECTED NOT DETECTED Final   Streptococcus agalactiae NOT DETECTED NOT DETECTED Final   Streptococcus pneumoniae NOT  DETECTED NOT DETECTED Final   Streptococcus pyogenes NOT DETECTED NOT DETECTED Final   Acinetobacter baumannii NOT DETECTED NOT DETECTED Final   Enterobacteriaceae species NOT DETECTED NOT DETECTED Final   Enterobacter cloacae complex NOT DETECTED NOT DETECTED Final   Escherichia coli NOT DETECTED NOT DETECTED Final   Klebsiella oxytoca NOT DETECTED NOT DETECTED Final   Klebsiella  pneumoniae NOT DETECTED NOT DETECTED Final   Proteus species NOT DETECTED NOT DETECTED Final   Serratia marcescens NOT DETECTED NOT DETECTED Final   Carbapenem resistance NOT DETECTED NOT DETECTED Final   Haemophilus influenzae NOT DETECTED NOT DETECTED Final   Neisseria meningitidis NOT DETECTED NOT DETECTED Final   Pseudomonas aeruginosa NOT DETECTED NOT DETECTED Final   Candida albicans NOT DETECTED NOT DETECTED Final   Candida glabrata NOT DETECTED NOT DETECTED Final   Candida krusei NOT DETECTED NOT DETECTED Final   Candida parapsilosis NOT DETECTED NOT DETECTED Final   Candida tropicalis NOT DETECTED NOT DETECTED Final  Urine culture     Status: None   Collection Time: 04/04/16  4:44 PM  Result Value Ref Range Status   Specimen Description URINE, RANDOM  Final   Special Requests NONE  Final   Culture NO GROWTH 2 DAYS  Final   Report Status 04/06/2016 FINAL  Final  Culture, blood (Routine X 2) w Reflex to ID Panel     Status: None (Preliminary result)   Collection Time: 04/04/16 10:06 PM  Result Value Ref Range Status   Specimen Description BLOOD LEFT ANTECUBITAL  Final   Special Requests BOTTLES DRAWN AEROBIC AND ANAEROBIC  Final   Culture NO GROWTH 3 DAYS  Final   Report Status PENDING  Incomplete    Coagulation Studies: No results for input(s): LABPROT, INR in the last 72 hours.  Urinalysis: No results for input(s): COLORURINE, LABSPEC, PHURINE, GLUCOSEU, HGBUR, BILIRUBINUR, KETONESUR, PROTEINUR, UROBILINOGEN, NITRITE, LEUKOCYTESUR in the last 72 hours.  Invalid input(s): APPERANCEUR     Imaging: Mr Lumbar Spine Wo Contrast  04/06/2016  CLINICAL DATA:  Low back pain. Prior MRI from [facility] dated 03/29/2016 not available for comparison was negative for discitis. EXAM: MRI LUMBAR SPINE WITHOUT CONTRAST TECHNIQUE: Multiplanar, multisequence MR imaging of the lumbar spine was performed. No intravenous contrast was administered. COMPARISON:  None. FINDINGS: Segmentation: Conventional anatomy assistant, with the last open disc space designated L5-S1. Alignment: 3 mm of anterolisthesis of L4 on L5 secondary to facet arthropathy. Bones: The vertebral bodies of the lumbar spine are normal in size. Bilateral L5 pars interarticularis defects. Partially visualized is mild marrow edema in the sacrum bilaterally with a subtle linear component most concerning for bilateral sacral insufficiency fractures. There is otherwise normal bone marrow signal demonstrated throughout the vertebra. The visualized portions of the SI joints are unremarkable. Conus medullaris: Extends to the T12-L1 level and appears normal. The nerve roots of the cauda equina and the filum terminale are normal. Paraspinal and other soft tissues: Mild paraspinal soft tissue edema. Small amount of fluid between the spinous processes at L2-3. Disc levels: Disc spaces: Mild degenerative disc disease with disc desiccation at L4-5. T12-L1: No significant disc bulge. No evidence of neural foraminal stenosis. No central canal stenosis. L1-L2: No significant disc bulge. No evidence of neural foraminal stenosis. No central canal stenosis. L2-L3: No significant disc bulge. No evidence of neural foraminal stenosis. No central canal stenosis. L3-L4: No significant disc bulge. No evidence of neural foraminal stenosis. No central canal stenosis. Mild bilateral facet arthropathy. L4-L5: Mild broad-based disc bulge. Severe bilateral facet arthropathy. No evidence of neural foraminal stenosis. No central canal stenosis. L5-S1: No significant disc bulge. No  evidence of neural foraminal stenosis. No central canal stenosis. Mild bilateral facet arthropathy. IMPRESSION: 1. Bilateral sacral insufficiency fractures partially visualized. Bone marrow edema partially visualized in the sacral ala bilaterally. 2. No evidence of discitis/osteomyelitis. 3. At L4-5 there  is a mild broad-based disc bulge. Severe bilateral facet arthropathy. 4. Mild paraspinal soft tissue edema along the posterior elements which may be reactive versus secondary to muscle strain versus less likely infection. Electronically Signed   By: Elige Ko   On: 04/06/2016 11:01     Medications:   . sodium chloride 20 mL/hr at 04/07/16 0800   .  ceFAZolin (ANCEF) IV  2 g Intravenous Q8H  . enoxaparin (LOVENOX) injection  40 mg Subcutaneous Q24H  . fentaNYL      . guaiFENesin  600 mg Oral BID  . midazolam      . pantoprazole  40 mg Oral QAC breakfast  . [START ON 04/08/2016] polyethylene glycol  17 g Oral QODAY  . senna-docusate  1 tablet Oral BID  . sodium chloride  500 mL Intravenous Once  . tiZANidine  4 mg Oral QHS  . venlafaxine XR  37.5 mg Oral Q breakfast   acetaminophen **OR** acetaminophen, albuterol, ALPRAZolam, diphenhydrAMINE, morphine injection, ondansetron **OR** ondansetron (ZOFRAN) IV, oxyCODONE-acetaminophen  Assessment/ Plan:  Danielle [name] is a 60 y.o. white female with GERD, osteoarthritis and hyperlipidemia, who was admitted to [facility] on 04/02/2016   1. Acute renal failure with proteinuria and hematuria: baseline creatinine of 0.69 from 4/12. On meloxicam at home (for many months according patient). Seems to have been given toradol at [facility] ED last week.  . Continue to monitor volume status and urine output.  - serologies negative - No acute indication for dialysis. Renally dose all medications. Holding nephrotoxic agents including meloxicam and other NSAIDs.   2. Urinary Tract Infection: patient was asymptomatic with dysuria, fevers and chills on admission.  However now with septic arthritis of right wrist. Appreciate ID input.   Will sign off, please call with questions.   LOS: 4 Danielle [name] 4/21/20171:43 PM

## 2016-04-07 NOTE — Progress Notes (Signed)
[facility] - [place] at [facility]   PATIENT NAME: Stefano GaulRebecca Thrush    MR#:  [id number]  DATE OF BIRTH:  [birth year]  SUBJECTIVE:   c/o weakness and generalized aches and pains.  REVIEW OF SYSTEMS:  CONSTITUTIONAL: No fever, fatigue , positive weakness.  EYES: No blurred or double vision.  EARS, NOSE, AND THROAT: No tinnitus or ear pain.  RESPIRATORY: No cough, shortness of breath, wheezing or hemoptysis.  CARDIOVASCULAR: No chest pain, orthopnea, edema.  GASTROINTESTINAL: No nausea, vomiting, diarrhea or abdominal pain.  GENITOURINARY: No dysuria, hematuria.  ENDOCRINE: No polyuria, nocturia,  HEMATOLOGY: No anemia, easy bruising or bleeding SKIN: right UE redness MUSCULOSKELETAL: Has chronic arthritis. Currently right hand and wrist joints are swollen  NEUROLOGIC: No tingling, numbness, weakness.  PSYCHIATRY: No anxiety or depression.   DRUG ALLERGIES:   Allergies  Allergen Reactions  . Compazine [Prochlorperazine Edisylate] Nausea And Vomiting    VITALS:  Blood pressure 153/73, pulse 82, temperature 98.3 F (36.8 C), temperature source Oral, resp. rate 18, height 5\' 1"  (1.549 m), weight 92.579 kg (204 lb 1.6 oz), SpO2 96 %.  PHYSICAL EXAMINATION:  GENERAL:  60 y.o.-year-old patient lying in the bed with no acute distress.  EYES: Pupils equal, round, reactive to light and accommodation. No scleral icterus. Extraocular muscles intact.  HEENT: Head atraumatic, normocephalic. Oropharynx and nasopharynx clear.  NECK:  Supple, no jugular venous distention. No thyroid enlargement, no tenderness.  LUNGS: Normal breath sounds bilaterally, no wheezing, rales,rhonchi or crepitation. No use of accessory muscles of respiration.  CARDIOVASCULAR: S1, S2 normal. No murmurs, rubs, or gallops.  ABDOMEN: Soft, nontender, nondistended. Bowel sounds present. No organomegaly or mass.  EXTREMITIES: Right wrist joint and dorsal aspect of the hand are edematous and tender  with decreased range of motion, radial pulse is 1-2+ .No pedal edema, cyanosis, or clubbing.  NEUROLOGIC: Cranial nerves II through XII are intact. Muscle strength 5/5 in all extremities. Sensation intact. Gait not checked.  PSYCHIATRIC: The patient is alert and oriented x 3.  SKIN: No obvious rash, lesion, or ulcer.    LABORATORY PANEL:   CBC  Recent Labs Lab 04/07/16 0435  WBC 25.0*  HGB 11.2*  HCT 34.5*  PLT 292   ------------------------------------------------------------------------------------------------------------------  Chemistries   Recent Labs Lab 04/02/16 1916  04/07/16 1032  NA 134*  < > 139  K 3.9  < > 3.9  CL 102  < > 109  CO2 18*  < > 23  GLUCOSE 120*  < > 79  BUN 69*  < > 35*  CREATININE 3.66*  < > 1.02*  CALCIUM 8.2*  < > 8.6*  AST 27  --   --   ALT 40  --   --   ALKPHOS 164*  --   --   BILITOT 0.2*  --   --   < > = values in this interval not displayed. ------------------------------------------------------------------------------------------------------------------  Cardiac Enzymes  Recent Labs Lab 04/02/16 1916  TROPONINI 0.06*   ------------------------------------------------------------------------------------------------------------------  RADIOLOGY:  Mr Lumbar Spine Wo Contrast  04/06/2016  CLINICAL DATA:  Low back pain. [facility]29[facility]2016 not available for comparison was negative for discitis. EXAM: MRI LUMBAR SPINE WITHOUT CONTRAST TECHNIQUE: Multiplanar, multisequence MR imaging of the lumbar spine was performed. No intravenous contrast was administered. COMPARISON:  None. FINDINGS: Segmentation: Conventional anatomy assistant, with the last open disc space designated L5-S1. Alignment: 3 mm of anterolisthesis of L4 on L5 secondary to facet arthropathy. Bones: The  vertebral bodies of the lumbar spine are normal in size. Bilateral L5 pars interarticularis defects. Partially visualized is mild marrow edema in the sacrum  bilaterally with a subtle linear component most concerning for bilateral sacral insufficiency fractures. There is otherwise normal bone marrow signal demonstrated throughout the vertebra. The visualized portions of the SI joints are unremarkable. Conus medullaris: Extends to the T12-L1 level and appears normal. The nerve roots of the cauda equina and the filum terminale are normal. Paraspinal and other soft tissues: Mild paraspinal soft tissue edema. Small amount of fluid between the spinous processes at L2-3. Disc levels: Disc spaces: Mild degenerative disc disease with disc desiccation at L4-5. T12-L1: No significant disc bulge. No evidence of neural foraminal stenosis. No central canal stenosis. L1-L2: No significant disc bulge. No evidence of neural foraminal stenosis. No central canal stenosis. L2-L3: No significant disc bulge. No evidence of neural foraminal stenosis. No central canal stenosis. L3-L4: No significant disc bulge. No evidence of neural foraminal stenosis. No central canal stenosis. Mild bilateral facet arthropathy. L4-L5: Mild broad-based disc bulge. Severe bilateral facet arthropathy. No evidence of neural foraminal stenosis. No central canal stenosis. L5-S1: No significant disc bulge. No evidence of neural foraminal stenosis. No central canal stenosis. Mild bilateral facet arthropathy. IMPRESSION: 1. Bilateral sacral insufficiency fractures partially visualized. Bone marrow edema partially visualized in the sacral ala bilaterally. 2. No evidence of discitis/osteomyelitis. 3. At L4-5 there is a mild broad-based disc bulge. Severe bilateral facet arthropathy. 4. Mild paraspinal soft tissue edema along the posterior elements which may be reactive versus secondary to muscle strain versus less likely infection. Electronically Signed   By: Elige Ko   On: 04/06/2016 11:01   ASSESSMENT AND PLAN:   60 year old female patient with history of osteoarthritis, GERD, back pain presented to the  emergency room with generalized weakness and transient disorientation.  # Acute kidney injury probably from dehydration from poor by mouth intake and taking meloxicam for back pain (recently prescribed) continue IV fluids and monitor renal function closely. Avoid nephrotoxins, d/c foley - nephrology consult appreciated Creat 3.69--3.89--3.16--2.63--1.63--1.02 -IVF d/ced by nephrology  #Right hand swelling/cellulitis with MSSA sepsis -Hand x-ray with no acute findings -No evidence of abscess per Dr.Menz -IV cefazolin per ID for atleast 2 weeks. To get PICC line today -redness improved. No s/o abscess -MRI lumbar spine no evidence of discitis  -TTE negative---> TEE.no vegetations appreciate help by dr Lady Gary  #. Urinary tract infection -UC mixed bacterial contamination  #. Leukocytosis which could be secondary to sepsis 27K--25k--22.2K--25K--cbc today pending  # Osteoarthritis Pain medications as needed  -MRI lumbar spine ??sacral insuff fracture  #. GERD-PPI   #DVT prophylaxis with subcutaneous heparin  #D/c planning to rehab All the records are reviewed and case discussed with Care Management/Social Workerr. Management plans discussed with the patient, and sister and they are in agreement.  CODE STATUS: full code  TOTAL TIME TAKING CARE OF THIS PATIENT: 25 minutes.   POSSIBLE D/C IN 2-3 DAYS, DEPENDING ON CLINICAL CONDITION.   Terrelle Ruffolo M.D on 04/07/2016 at 12:20 PM  Between 7am to 6pm - Pager - [phone] After 6pm go to [email or web address] - password [email or web address] [facility]  [place] [facility]  Office  [phone]  CC: Primary care physician; No primary care provider on file.

## 2016-04-07 NOTE — Progress Notes (Signed)
*  PRELIMINARY RESULTS* Echocardiogram Echocardiogram Transesophageal has been performed.  Danielle [name] 04/07/2016, 9:11 AM

## 2016-04-07 NOTE — Progress Notes (Addendum)
Physical Therapy Cancellation Note Patient Details Name: Danielle [name] MRN: [id number] DOB: 12-15-1956 Today's Date: 04/07/2016    History of Present Illness      PT Comments    Attempted session x 2 today.  Pt out for testing in AM and in room procedure in PM.   Follow Up Recommendations        Equipment Recommendations       Recommendations for Other Services       Precautions / Restrictions Restrictions Weight Bearing Restrictions: No    Mobility  Bed Mobility                  Transfers                    Ambulation/Gait                 Stairs            Wheelchair Mobility    Modified Rankin (Stroke Patients Only)       Balance                                    Cognition                            Exercises      General Comments        Pertinent Vitals/Pain      Home Living                      Prior Function            PT Goals (current goals can now be found in the care plan section)      Frequency       PT Plan      Co-evaluation             End of Session           Time:  -     Charges:                       G Codes:      Danielle DessSarah Kaylinn Dedic, PTA 04/07/2016, 3:36 PM

## 2016-04-07 NOTE — Progress Notes (Signed)
Pt remains clinically stable post tee per Dr Lady GaryFath, denies complaints,tolerated procedure well, awakens readily, alert and oriented, vss, report called to nurse on 2c with plan reviewed,

## 2016-04-07 NOTE — Clinical Social Work Note (Signed)
MD spoke with CSW and patient is anticipated to discharge on Monday. [facility] Kelby [place] is obtaining the [place]. York SpanielMonica Issabela Lesko MSW,LCSW [phone]

## 2016-04-07 NOTE — CV Procedure (Signed)
    TRANSESOPHAGEAL ECHOCARDIOGRAM   NAME:  Danielle [name]   MRN: [id number] DOB:  [birth year]   ADMIT DATE: 04/02/2016  INDICATIONS: Benn MoulderBactaremia  PROCEDURE:   Informed consent was obtained prior to the procedure. The risks, benefits and alternatives for the procedure were discussed and the patient comprehended these risks.  Risks include, but are not limited to, cough, sore throat, vomiting, nausea, somnolence, esophageal and stomach trauma or perforation, bleeding, low blood pressure, aspiration, pneumonia, infection, trauma to the teeth and death.    After a procedural time-out, the patient was given 2mg  versed and 50cg fentanyl for moderate sedation.  The oropharynx was anesthetized 1cc of topical 1% viscous lidocaine.  The transesophageal probe was inserted in the esophagus and stomach without difficulty and multiple views were obtained.    COMPLICATIONS:    There were no immediate complications.  FINDINGS:  LEFT VENTRICLE: EF = 55%. No regional wall motion abnormalities.  RIGHT VENTRICLE: Normal size and function.   LEFT ATRIUM: Normal   LEFT ATRIAL APPENDAGE: No thrombus.   RIGHT ATRIUM: normal  AORTIC VALVE:  Trileaflet. No vegetations noted. No regurg  MITRAL VALVE:    Borderline mitral valve prolapse. No  Vegetations. Trivial to mild mr  TRICUSPID VALVE: Normal. No vegetations. Trivial to mild tr  PULMONIC VALVE: Grossly normal.  INTERATRIAL SEPTUM: No PFO or ASD. Agitated saline contrast use.   PERICARDIUM: No effusion  DESCENDING AORTA: Normal   CONCLUSION:  No evidence of vegetations. Cannot rule out endocarditis.

## 2016-04-07 NOTE — Progress Notes (Signed)
[place] INFECTIOUS DISEASE PROGRESS NOTE Date of Admission:  04/02/2016     ID: Danielle [name] is a 60 y.o. female with MSSA bacteremia, hand cellulitis, LBP Principal Problem:   Dehydration Active Problems:   Acute renal failure (ARF) (HCC)   Acute renal failure (HCC)  Subjective: No fevers, wbc remains elevated 25.   ROS  Eleven systems are reviewed and negative except per hpi  Medications:  Antibiotics Given (last 72 hours)    Date/Time Action Medication Dose Rate   04/04/16 1951 Given  [IV infiltrated atr time due, now have Iv access]   ceFAZolin (ANCEF) IVPB 2g/100 mL premix 2 g 200 mL/hr   04/05/16 0759 Given   ceFAZolin (ANCEF) IVPB 2g/100 mL premix 2 g 200 mL/hr   04/05/16 2011 Given   ceFAZolin (ANCEF) IVPB 2g/100 mL premix 2 g 200 mL/hr   04/06/16 0809 Given   ceFAZolin (ANCEF) IVPB 2g/100 mL premix 2 g 200 mL/hr   04/06/16 1331 Given   ceFAZolin (ANCEF) IVPB 2g/100 mL premix 2 g 200 mL/hr   04/06/16 2105 Given   ceFAZolin (ANCEF) IVPB 2g/100 mL premix 2 g 200 mL/hr   04/07/16 0533 Given   ceFAZolin (ANCEF) IVPB 2g/100 mL premix 2 g 200 mL/hr     .  ceFAZolin (ANCEF) IV  2 g Intravenous Q8H  . enoxaparin (LOVENOX) injection  40 mg Subcutaneous Q24H  . fentaNYL      . guaiFENesin  600 mg Oral BID  . midazolam      . pantoprazole  40 mg Oral QAC breakfast  . [START ON 04/08/2016] polyethylene glycol  17 g Oral QODAY  . senna-docusate  1 tablet Oral BID  . sodium chloride  500 mL Intravenous Once  . tiZANidine  4 mg Oral QHS  . venlafaxine XR  37.5 mg Oral Q breakfast    Objective: Vital signs in last 24 hours: Temp:  [97.5 F (36.4 C)-98.6 F (37 C)] 98.3 F (36.8 C) (04/21 1000) Pulse Rate:  [71-91] 82 (04/21 1000) Resp:  [16-20] 18 (04/21 1000) BP: (129-158)/(61-95) 153/73 mmHg (04/21 1000) SpO2:  [93 %-100 %] 96 % (04/21 1000) Constitutional: oriented to person, place, and time. appears well-developed and well-nourished. No distress.   HENT: [place]/AT, PERRLA, no scleral icterus Mouth/Throat: Oropharynx is clear and moist. No oropharyngeal exudate.  Cardiovascular: Normal rate, regular rhythm 2/6 sm  Pulmonary/Chest: bil rhonchi and wheeze Neck = supple, no nuchal rigidity Abdominal: Soft. Bowel sounds are normal. exhibits no distension. There is no tenderness.  Lymphadenopathy: no cervical adenopathy. No axillary adenopathy Neurological: alert and oriented to person, place, and time.  Skin: R dorsum of hand with marked swelling, tendernss, warmth.  Psychiatric: a normal mood and affect. behavior is normal.   Lab Results  Recent Labs  04/04/16 2206 04/06/16 0352 04/07/16 0435 04/07/16 1032  WBC 25.0*  --  25.0*  --   HGB 11.5*  --  11.2*  --   HCT 34.5*  --  34.5*  --   NA 134* 141  --  139  K 4.0 4.1  --  3.9  CL 105 112*  --  109  CO2 15* 21*  --  23  BUN 62* 54*  --  35*  CREATININE 2.63* 1.64*  --  1.02*    Microbiology: Results for orders placed or performed during the hospital encounter of 04/02/16  Culture, blood (routine x 2)     Status: Abnormal   Collection Time: 04/02/16  7:16 PM  Result Value Ref Range Status   Specimen Description BLOOD LEFT ASSIST CONTROL  Final   Special Requests BOTTLES DRAWN AEROBIC AND ANAEROBIC [place]  Final   Culture  Setup Time   Final    GRAM POSITIVE COCCI IN BOTH AEROBIC AND ANAEROBIC BOTTLES CRITICAL VALUE NOTED.  VALUE IS CONSISTENT WITH PREVIOUSLY REPORTED AND CALLED VALUE.    Culture (A)  Final    STAPHYLOCOCCUS AUREUS IN BOTH AEROBIC AND ANAEROBIC BOTTLES    Report Status 04/05/2016 FINAL  Final   Organism ID, Bacteria STAPHYLOCOCCUS AUREUS  Final      Susceptibility   Staphylococcus aureus - MIC*    CIPROFLOXACIN <=0.5 SENSITIVE Sensitive     ERYTHROMYCIN <=0.25 SENSITIVE Sensitive     GENTAMICIN <=0.5 SENSITIVE Sensitive     OXACILLIN 0.5 SENSITIVE Sensitive     TETRACYCLINE 2 SENSITIVE Sensitive     VANCOMYCIN 1 SENSITIVE Sensitive      TRIMETH/SULFA <=10 SENSITIVE Sensitive     CLINDAMYCIN <=0.25 SENSITIVE Sensitive     RIFAMPIN <=0.5 SENSITIVE Sensitive     Inducible Clindamycin NEGATIVE Sensitive     * STAPHYLOCOCCUS AUREUS  Culture, blood (routine x 2)     Status: Abnormal   Collection Time: 04/02/16  7:16 PM  Result Value Ref Range Status   Specimen Description BLOOD RIGHT HAND  Final   Special Requests BOTTLES DRAWN AEROBIC AND ANAEROBIC [place]  Final   Culture  Setup Time   Final    GRAM POSITIVE COCCI IN BOTH AEROBIC AND ANAEROBIC BOTTLES CRITICAL RESULT CALLED TO, READ BACK BY AND VERIFIED WITH: KAREN HAYES AT 8119 ON 04/03/16 BY CTJ.    Culture (A)  Final    STAPHYLOCOCCUS AUREUS IN BOTH AEROBIC AND ANAEROBIC BOTTLES    Report Status 04/05/2016 FINAL  Final   Organism ID, Bacteria STAPHYLOCOCCUS AUREUS  Final      Susceptibility   Staphylococcus aureus - MIC*    CIPROFLOXACIN <=0.5 SENSITIVE Sensitive     ERYTHROMYCIN <=0.25 SENSITIVE Sensitive     GENTAMICIN <=0.5 SENSITIVE Sensitive     OXACILLIN 0.5 SENSITIVE Sensitive     TETRACYCLINE 2 SENSITIVE Sensitive     VANCOMYCIN 1 SENSITIVE Sensitive     TRIMETH/SULFA <=10 SENSITIVE Sensitive     CLINDAMYCIN <=0.25 SENSITIVE Sensitive     RIFAMPIN <=0.5 SENSITIVE Sensitive     Inducible Clindamycin NEGATIVE Sensitive     * STAPHYLOCOCCUS AUREUS  Urine culture     Status: None   Collection Time: 04/02/16  7:16 PM  Result Value Ref Range Status   Specimen Description URINE, RANDOM  Final   Special Requests NONE  Final   Culture MULTIPLE SPECIES PRESENT, SUGGEST RECOLLECTION  Final   Report Status 04/04/2016 FINAL  Final  Blood Culture ID Panel (Reflexed)     Status: Abnormal   Collection Time: 04/02/16  7:16 PM  Result Value Ref Range Status   Enterococcus species NOT DETECTED NOT DETECTED Final   Vancomycin resistance NOT DETECTED NOT DETECTED Final   Listeria monocytogenes NOT DETECTED NOT DETECTED Final   Staphylococcus species  DETECTED (A) NOT DETECTED Final    Comment: CRITICAL RESULT CALLED TO, READ BACK BY AND VERIFIED WITH: KAREN HAYES FOR STAPHYLOCOCCUS SPECIES AND STAPHYLOCOCCUS AUREUS AT 1338 ON 04/03/16 BY CTJ.    Staphylococcus aureus DETECTED (A) NOT DETECTED Final   Methicillin resistance NOT DETECTED NOT DETECTED Final   Streptococcus species NOT DETECTED NOT DETECTED Final   Streptococcus agalactiae NOT DETECTED  NOT DETECTED Final   Streptococcus pneumoniae NOT DETECTED NOT DETECTED Final   Streptococcus pyogenes NOT DETECTED NOT DETECTED Final   Acinetobacter baumannii NOT DETECTED NOT DETECTED Final   Enterobacteriaceae species NOT DETECTED NOT DETECTED Final   Enterobacter cloacae complex NOT DETECTED NOT DETECTED Final   Escherichia coli NOT DETECTED NOT DETECTED Final   Klebsiella oxytoca NOT DETECTED NOT DETECTED Final   Klebsiella pneumoniae NOT DETECTED NOT DETECTED Final   Proteus species NOT DETECTED NOT DETECTED Final   Serratia marcescens NOT DETECTED NOT DETECTED Final   Carbapenem resistance NOT DETECTED NOT DETECTED Final   Haemophilus influenzae NOT DETECTED NOT DETECTED Final   Neisseria meningitidis NOT DETECTED NOT DETECTED Final   Pseudomonas aeruginosa NOT DETECTED NOT DETECTED Final   Candida albicans NOT DETECTED NOT DETECTED Final   Candida glabrata NOT DETECTED NOT DETECTED Final   Candida krusei NOT DETECTED NOT DETECTED Final   Candida parapsilosis NOT DETECTED NOT DETECTED Final   Candida tropicalis NOT DETECTED NOT DETECTED Final  Urine culture     Status: None   Collection Time: 04/04/16  4:44 PM  Result Value Ref Range Status   Specimen Description URINE, RANDOM  Final   Special Requests NONE  Final   Culture NO GROWTH 2 DAYS  Final   Report Status 04/06/2016 FINAL  Final  Culture, blood (Routine X 2) w Reflex to ID Panel     Status: None (Preliminary result)   Collection Time: 04/04/16 10:06 PM  Result Value Ref Range Status   Specimen Description BLOOD LEFT  ANTECUBITAL  Final   Special Requests BOTTLES DRAWN AEROBIC AND ANAEROBIC 5ML  Final   Culture NO GROWTH 3 DAYS  Final   Report Status PENDING  Incomplete    Studies/Results: Mr Lumbar Spine Wo Contrast  04/06/2016  CLINICAL DATA:  Low back pain. Prior MRI from [facility] dated 03/29/2016 not available for comparison was negative for discitis. EXAM: MRI LUMBAR SPINE WITHOUT CONTRAST TECHNIQUE: Multiplanar, multisequence MR imaging of the lumbar spine was performed. No intravenous contrast was administered. COMPARISON:  None. FINDINGS: Segmentation: Conventional anatomy assistant, with the last open disc space designated L5-S1. Alignment: 3 mm of anterolisthesis of L4 on L5 secondary to facet arthropathy. Bones: The vertebral bodies of the lumbar spine are normal in size. Bilateral L5 pars interarticularis defects. Partially visualized is mild marrow edema in the sacrum bilaterally with a subtle linear component most concerning for bilateral sacral insufficiency fractures. There is otherwise normal bone marrow signal demonstrated throughout the vertebra. The visualized portions of the SI joints are unremarkable. Conus medullaris: Extends to the T12-L1 level and appears normal. The nerve roots of the cauda equina and the filum terminale are normal. Paraspinal and other soft tissues: Mild paraspinal soft tissue edema. Small amount of fluid between the spinous processes at L2-3. Disc levels: Disc spaces: Mild degenerative disc disease with disc desiccation at L4-5. T12-L1: No significant disc bulge. No evidence of neural foraminal stenosis. No central canal stenosis. L1-L2: No significant disc bulge. No evidence of neural foraminal stenosis. No central canal stenosis. L2-L3: No significant disc bulge. No evidence of neural foraminal stenosis. No central canal stenosis. L3-L4: No significant disc bulge. No evidence of neural foraminal stenosis. No central canal stenosis. Mild bilateral facet arthropathy. L4-L5: Mild  broad-based disc bulge. Severe bilateral facet arthropathy. No evidence of neural foraminal stenosis. No central canal stenosis. L5-S1: No significant disc bulge. No evidence of neural foraminal stenosis. No central canal stenosis. Mild bilateral facet  arthropathy. IMPRESSION: 1. Bilateral sacral insufficiency fractures partially visualized. Bone marrow edema partially visualized in the sacral ala bilaterally. 2. No evidence of discitis/osteomyelitis. 3. At L4-5 there is a mild broad-based disc bulge. Severe bilateral facet arthropathy. 4. Mild paraspinal soft tissue edema along the posterior elements which may be reactive versus secondary to muscle strain versus less likely infection. Electronically Signed   By: Kathreen Devoid   On: 04/06/2016 11:01   Echo Study Conclusions  - Left ventricle: The cavity size was normal. Systolic function was  normal. The estimated ejection fraction was in the range of 60%  to 65%. Wall motion was normal; there were no regional wall  motion abnormalities. Left ventricular diastolic function  parameters were normal. - Mitral valve: There was mild regurgitation. - Left atrium: The atrium was normal in size. - Right ventricle: Systolic function was normal. - Pulmonary arteries: Systolic pressure was mildly elevated. PA  peak pressure: 43 mm Hg (S).  Impressions:  - No valve vegetation noted.  Assessment/Plan: Assessment:  TAILOR LUCKING is a 60 y.o. female with MSSA bacteremia, R wrist swelling and cellulitis (possible septic arthritis) and LBP (concern for discitis but MRI 4/12 at [facility] negative). Also some L shoulder pain  ESR 109, HIV neg, CRP P,  FU Bcx 4/18 ngtd ECHO TTE - no veg, mild MR  Recommendations Continue Ancef Elevate R arm TEE negative- MRI back negative Will need 2 weeks IV abx from first negative culture - see abx order sheet Thank you very much for the consult. Will follow with you.  Emilliano Dilworth P   04/07/2016, 2:29  PM

## 2016-04-07 NOTE — Progress Notes (Signed)
Infectious Disease Long Term IV Antibiotic Orders  Diagnosis: MSSA bacteremia, hand abscess  Culture results MSSA   Culture -- (A)      Result:     STAPHYLOCOCCUS AUREUS  IN BOTH AEROBIC AND ANAEROBIC BOTTLES      Report Status 04/05/2016 FINAL     Organism ID, Bacteria STAPHYLOCOCCUS AUREUS    Culture & Susceptibility    STAPHYLOCOCCUS AUREUS    Antibiotic Sensitivity Microscan Status   CIPROFLOXACIN Sensitive <=0.5 SENSITIVE Final   Method: MIC   CLINDAMYCIN Sensitive <=0.25 SENSITIVE Final   Method: MIC   ERYTHROMYCIN Sensitive <=0.25 SENSITIVE Final   Method: MIC   GENTAMICIN Sensitive <=0.5 SENSITIVE Final   Method: MIC   Inducible Clindamycin Sensitive NEGATIVE Final   Method: MIC   OXACILLIN Sensitive 0.5 SENSITIVE Final   Method: MIC   RIFAMPIN Sensitive <=0.5 SENSITIVE Final   Method: MIC   TETRACYCLINE Sensitive 2 SENSITIVE Final   Method: MIC   TRIMETH/SULFA Sensitive <=10 SENSITIVE Final   Method: MIC   VANCOMYCIN Sensitive 1 SENSITIVE Final   Method: MIC           Allergies:  Allergies  Allergen Reactions  . Compazine [Prochlorperazine Edisylate] Nausea And Vomiting    Discharge antibiotics  Cefazolin    2   grams every  8 hours  PICC Care per protocol Labs weekly while on IV antibiotics      CBC w diff   Comprehensive met panel  CRP  ESR  Baseline labs - cr 1.02, wbc 25, hgb 11.2, plt 292, crp 34.9,   Planned duration of antibiotics 2 weeks from 4/18  Stop date Apr 18 2016  Follow up clinic date TBD  FAX weekly labs to [phone]  Leonel Ramsay, MD

## 2016-04-08 LAB — RENAL FUNCTION PANEL
ANION GAP: 7 (ref 5–15)
Albumin: 2.1 g/dL — ABNORMAL LOW (ref 3.5–5.0)
BUN: 27 mg/dL — ABNORMAL HIGH (ref 6–20)
CHLORIDE: 109 mmol/L (ref 101–111)
CO2: 23 mmol/L (ref 22–32)
Calcium: 8.4 mg/dL — ABNORMAL LOW (ref 8.9–10.3)
Creatinine, Ser: 0.81 mg/dL (ref 0.44–1.00)
GFR calc Af Amer: >60 mL/min (ref >60)
GFR calc non Af Amer: >60 mL/min (ref >60)
GLUCOSE: 93 mg/dL (ref 65–99)
PHOSPHORUS: 3 mg/dL (ref 2.5–4.6)
Potassium: 3.9 mmol/L (ref 3.5–5.1)
SODIUM: 139 mmol/L (ref 135–145)

## 2016-04-08 NOTE — Progress Notes (Signed)
[facility] - [place] at [facility]   PATIENT NAME: Stefano GaulRebecca Pisarski    MR#:  [id number]  DATE OF BIRTH:  Oct 27, 1956  SUBJECTIVE:   c/o weakness and generalized aches and pains.  S/p PICC/midline placement REVIEW OF SYSTEMS:  CONSTITUTIONAL: No fever, fatigue , positive weakness.  EYES: No blurred or double vision.  EARS, NOSE, AND THROAT: No tinnitus or ear pain.  RESPIRATORY: No cough, shortness of breath, wheezing or hemoptysis.  CARDIOVASCULAR: No chest pain, orthopnea, edema.  GASTROINTESTINAL: No nausea, vomiting, diarrhea or abdominal pain.  GENITOURINARY: No dysuria, hematuria.  ENDOCRINE: No polyuria, nocturia,  HEMATOLOGY: No anemia, easy bruising or bleeding SKIN: right UE redness MUSCULOSKELETAL: Has chronic arthritis. Currently right hand and wrist joints are swollen  NEUROLOGIC: No tingling, numbness, weakness.  PSYCHIATRY: No anxiety or depression.   DRUG ALLERGIES:   Allergies  Allergen Reactions  . Compazine [Prochlorperazine Edisylate] Nausea And Vomiting    VITALS:  Blood pressure 137/69, pulse 83, temperature 98 F (36.7 C), temperature source Oral, resp. rate 20, height 5\' 1"  (1.549 m), weight 92.579 kg (204 lb 1.6 oz), SpO2 94 %.  PHYSICAL EXAMINATION:  GENERAL:  60 y.o.-year-old patient lying in the bed with no acute distress.  EYES: Pupils equal, round, reactive to light and accommodation. No scleral icterus. Extraocular muscles intact.  HEENT: Head atraumatic, normocephalic. Oropharynx and nasopharynx clear.  NECK:  Supple, no jugular venous distention. No thyroid enlargement, no tenderness.  LUNGS: Normal breath sounds bilaterally, no wheezing, rales,rhonchi or crepitation. No use of accessory muscles of respiration.  CARDIOVASCULAR: S1, S2 normal. No murmurs, rubs, or gallops.  ABDOMEN: Soft, nontender, nondistended. Bowel sounds present. No organomegaly or mass.  EXTREMITIES: Right wrist joint and dorsal aspect of the hand  are edematous and tender with decreased range of motion, radial pulse is 1-2+ .No pedal edema, cyanosis, or clubbing.  Left UE midline placement NEUROLOGIC: Cranial nerves II through XII are intact. Muscle strength 5/5 in all extremities. Sensation intact. Gait not checked.  PSYCHIATRIC: The patient is alert and oriented x 3.  SKIN: No obvious rash, lesion, or ulcer.    LABORATORY PANEL:   CBC  Recent Labs Lab 04/07/16 0435  WBC 25.0*  HGB 11.2*  HCT 34.5*  PLT 292   ------------------------------------------------------------------------------------------------------------------  Chemistries   Recent Labs Lab 04/02/16 1916  04/08/16 0458  NA 134*  < > 139  K 3.9  < > 3.9  CL 102  < > 109  CO2 18*  < > 23  GLUCOSE 120*  < > 93  BUN 69*  < > 27*  CREATININE 3.66*  < > 0.81  CALCIUM 8.2*  < > 8.4*  AST 27  --   --   ALT 40  --   --   ALKPHOS 164*  --   --   BILITOT 0.2*  --   --   < > = values in this interval not displayed. ------------------------------------------------------------------------------------------------------------------  Cardiac Enzymes  Recent Labs Lab 04/02/16 1916  TROPONINI 0.06*   ------------------------------------------------------------------------------------------------------------------  RADIOLOGY:  No results found. ASSESSMENT AND PLAN:   60 year old female patient with history of osteoarthritis, GERD, back pain presented to the emergency room with generalized weakness and transient disorientation.  # Acute kidney injury probably from dehydration from poor by mouth intake and taking meloxicam for back pain (recently prescribed) continue IV fluids and monitor renal function closely. Avoid nephrotoxins, d/c foley - nephrology consult appreciated Creat 3.69--3.89--3.16--2.63--1.63--1.02--o.7 -IVF d/ced by nephrology  #Right  hand swelling/cellulitis with MSSA sepsis -Hand x-ray with no acute findings -No evidence of abscess  per Dr.Menz -IV cefazolin per ID for atleast 2 weeks. S/p MIDLine placement on 4/21 -redness improved. No s/o abscess -MRI lumbar spine no evidence of discitis  -TTE negative---> TEE.no vegetations appreciate help by dr Lady Gary  #. Urinary tract infection -UC mixed bacterial contamination  #. Leukocytosis which could be secondary to sepsis 27K--25k--22.2K--25K--cbc today pending  # Osteoarthritis Pain medications as needed   #. GERD-PPI   #DVT prophylaxis with subcutaneous heparin  #D/c planning to rehab on monday All the records are reviewed and case discussed with Care Management/Social Workerr. Management plans discussed with the patient, and sister and they are in agreement.  CODE STATUS: full code  TOTAL TIME TAKING CARE OF THIS PATIENT: 25 minutes.   POSSIBLE D/C IN 2 DAYS, DEPENDING ON CLINICAL CONDITION.   Nike Southwell M.D on 04/08/2016 at 12:05 PM  Between 7am to 6pm - Pager - [phone] After 6pm go to [email or web address] - password [email or web address] [facility]  [facility]  Office  [phone]  CC: Primary care physician; No primary care provider on file.

## 2016-04-09 LAB — CBC
HCT: 33.4 % — ABNORMAL LOW (ref 35.0–47.0)
Hemoglobin: 11.2 g/dL — ABNORMAL LOW (ref 12.0–16.0)
MCH: 28.8 pg (ref 26.0–34.0)
MCHC: 33.5 g/dL (ref 32.0–36.0)
MCV: 86 fL (ref 80.0–100.0)
PLATELETS: 254 10*3/uL (ref 150–440)
RBC: 3.89 MIL/uL (ref 3.80–5.20)
RDW: 15.1 % — ABNORMAL HIGH (ref 11.5–14.5)
WBC: 28.2 10*3/uL — ABNORMAL HIGH (ref 3.6–11.0)

## 2016-04-09 LAB — BLOOD CULTURE ID PANEL (REFLEXED)
ACINETOBACTER BAUMANNII: NOT DETECTED
CANDIDA ALBICANS: NOT DETECTED
CANDIDA KRUSEI: NOT DETECTED
CARBAPENEM RESISTANCE: NOT DETECTED
Candida glabrata: NOT DETECTED
Candida parapsilosis: NOT DETECTED
Candida tropicalis: NOT DETECTED
ENTEROBACTER CLOACAE COMPLEX: NOT DETECTED
ENTEROBACTERIACEAE SPECIES: NOT DETECTED
Enterococcus species: NOT DETECTED
Escherichia coli: NOT DETECTED
Haemophilus influenzae: NOT DETECTED
KLEBSIELLA OXYTOCA: NOT DETECTED
KLEBSIELLA PNEUMONIAE: NOT DETECTED
Listeria monocytogenes: NOT DETECTED
Methicillin resistance: DETECTED — AB
NEISSERIA MENINGITIDIS: NOT DETECTED
PSEUDOMONAS AERUGINOSA: NOT DETECTED
Proteus species: NOT DETECTED
STAPHYLOCOCCUS AUREUS BCID: NOT DETECTED
STAPHYLOCOCCUS SPECIES: DETECTED — AB
STREPTOCOCCUS AGALACTIAE: NOT DETECTED
STREPTOCOCCUS PNEUMONIAE: NOT DETECTED
STREPTOCOCCUS SPECIES: NOT DETECTED
Serratia marcescens: NOT DETECTED
Streptococcus pyogenes: NOT DETECTED
VANCOMYCIN RESISTANCE: NOT DETECTED

## 2016-04-09 LAB — CULTURE, BLOOD (ROUTINE X 2): Culture: NO GROWTH

## 2016-04-09 NOTE — Progress Notes (Signed)
BCID note  [employer] resulted Staphylooccus spp. mecA (+), Gram stain GPC x1 anaerobic. Patient is already on Ancef for MSSA bacteremia and is followed by ID. WBC elevated but stable and patient is apparently stable pending discharge. For now will regard as possible contaminant and hold off on escalation of antibiotics per conversation with hospitalist.

## 2016-04-09 NOTE — Progress Notes (Signed)
[facility] - [place] at [facility]   PATIENT NAME: Danielle [name]    MR#:  [id number]  DATE OF BIRTH:  September 22, 1956  SUBJECTIVE:   c/o weakness and generalized aches and pains.  S/p PICC/midline placement REVIEW OF SYSTEMS:  CONSTITUTIONAL: No fever, fatigue , positive weakness.  EYES: No blurred or double vision.  EARS, NOSE, AND THROAT: No tinnitus or ear pain.  RESPIRATORY: No cough, shortness of breath, wheezing or hemoptysis.  CARDIOVASCULAR: No chest pain, orthopnea, edema.  GASTROINTESTINAL: No nausea, vomiting, diarrhea or abdominal pain.  GENITOURINARY: No dysuria, hematuria.  ENDOCRINE: No polyuria, nocturia,  HEMATOLOGY: No anemia, easy bruising or bleeding SKIN: right UE redness MUSCULOSKELETAL: Has chronic arthritis. Currently right hand and wrist joints are swollen  NEUROLOGIC: No tingling, numbness, weakness.  PSYCHIATRY: No anxiety or depression.   DRUG ALLERGIES:   Allergies  Allergen Reactions  . Compazine [Prochlorperazine Edisylate] Nausea And Vomiting    VITALS:  Blood pressure 139/66, pulse 90, temperature 98.9 F (37.2 C), temperature source Oral, resp. rate 20, height  (1.549 m), weight 92.579 kg (204 lb 1.6 oz), SpO2 95 %.  PHYSICAL EXAMINATION:  GENERAL:  60 y.o.-year-old patient lying in the bed with no acute distress.  EYES: Pupils equal, round, reactive to light and accommodation. No scleral icterus. Extraocular muscles intact.  HEENT: Head atraumatic, normocephalic. Oropharynx and nasopharynx clear.  NECK:  Supple, no jugular venous distention. No thyroid enlargement, no tenderness.  LUNGS: Normal breath sounds bilaterally, no wheezing, rales,rhonchi or crepitation. No use of accessory muscles of respiration.  CARDIOVASCULAR: S1, S2 normal. No murmurs, rubs, or gallops.  ABDOMEN: Soft, nontender, nondistended. Bowel sounds present. No organomegaly or mass.  EXTREMITIES: Right wrist joint and dorsal aspect of the  hand are edematous . Marland KitchenNo pedal edema, cyanosis, or clubbing.  Left UE midline placement NEUROLOGIC: Cranial nerves II through XII are intact. Muscle strength 5/5 in all extremities. Sensation intact. Gait not checked.  PSYCHIATRIC:  patient is alert and oriented x 3.  SKIN: No obvious rash, lesion, or ulcer.   LABORATORY PANEL:   CBC  Recent Labs Lab 04/09/16 0952  WBC 28.2*  HGB 11.2*  HCT 33.4*  PLT 254   ------------------------------------------------------------------------------------------------------------------  Chemistries   Recent Labs Lab 04/02/16 1916  04/08/16 0458  NA 134*  < > 139  K 3.9  < > 3.9  CL 102  < > 109  CO2 18*  < > 23  GLUCOSE 120*  < > 93  BUN 69*  < > 27*  CREATININE 3.66*  < > 0.81  CALCIUM 8.2*  < > 8.4*  AST 27  --   --   ALT 40  --   --   ALKPHOS 164*  --   --   BILITOT 0.2*  --   --   < > = values in this interval not displayed. ------------------------------------------------------------------------------------------------------------------  Cardiac Enzymes  Recent Labs Lab 04/02/16 1916  TROPONINI 0.06*   ------------------------------------------------------------------------------------------------------------------  RADIOLOGY:  No results found. ASSESSMENT AND PLAN:   60 year old female patient with history of osteoarthritis, GERD, back pain presented to the emergency room with generalized weakness and transient disorientation.  # Acute kidney injury probably from dehydration from poor by mouth intake and taking meloxicam for back pain (recently prescribed) continue IV fluids and monitor renal function closely. Avoid nephrotoxins, d/c foley - nephrology consult appreciated Creat 3.69--3.89--3.16--2.63--1.63--1.02--0.7 -IVF d/ced by nephrology  #Right hand swelling/cellulitis with MSSA sepsis -Hand x-ray with no acute findings -  No evidence of abscess per Dr.Menz -IV cefazolin per ID for atleast 2 weeks. S/p  MIDLine placement on 4/21 -redness improved. No s/o abscess -MRI lumbar spine no evidence of discitis  -TTE negative---> TEE.no vegetations appreciate help by dr Lady Garyfath  #. Urinary tract infection -UC mixed bacterial contamination  #. Leukocytosis which could be secondary to sepsis 27K--25k--22.2K--25K--28k -Patient is overall doing much better. Not sure why her white count is coming up.  # Osteoarthritis Pain medications as needed   #. GERD-PPI   #DVT prophylaxis with subcutaneous heparin  #D/c planning to rehab on Monday if okay with ID.  All the records are reviewed and case discussed with Care Management/Social Workerr. Management plans discussed with the patient, and sister and they are in agreement.  CODE STATUS: full code  TOTAL TIME TAKING CARE OF THIS PATIENT: 25 minutes.   POSSIBLE D/C IN 2 DAYS, DEPENDING ON CLINICAL CONDITION.   Gayleen Sholtz M.D on 04/09/2016 at 11:41 AM  Between 7am to 6pm - Pager - [phone] After 6pm go to [email or web address] - password [email or web address] [facility]  [facility]  Office  [phone]  CC: Primary care physician; No primary care provider on file.

## 2016-04-10 LAB — CBC
HCT: 32.2 % — ABNORMAL LOW (ref 35.0–47.0)
HEMOGLOBIN: 10.8 g/dL — AB (ref 12.0–16.0)
MCH: 28.8 pg (ref 26.0–34.0)
MCHC: 33.4 g/dL (ref 32.0–36.0)
MCV: 86.2 fL (ref 80.0–100.0)
Platelets: 270 10*3/uL (ref 150–440)
RBC: 3.74 MIL/uL — AB (ref 3.80–5.20)
RDW: 15.2 % — ABNORMAL HIGH (ref 11.5–14.5)
WBC: 25 10*3/uL — ABNORMAL HIGH (ref 3.6–11.0)

## 2016-04-10 MED ORDER — ACETAMINOPHEN 325 MG PO TABS: 650.0000 mg | ORAL_TABLET | Freq: Four times a day (QID) | ORAL | Status: AC | PRN

## 2016-04-10 MED ORDER — POLYETHYLENE GLYCOL 3350 17 G PO PACK: 17.0000 g | PACK | ORAL | Status: AC

## 2016-04-10 MED ORDER — CEFAZOLIN SODIUM-DEXTROSE 2-4 GM/100ML-% IV SOLN: 2.0000 g | Freq: Three times a day (TID) | INTRAVENOUS | Status: AC

## 2016-04-10 MED ORDER — OXYCODONE-ACETAMINOPHEN 5-325 MG PO TABS: 1.0000 | ORAL_TABLET | Freq: Four times a day (QID) | ORAL | Status: AC | PRN

## 2016-04-10 NOTE — Discharge Summary (Signed)
[facility] - [place] at [facility]   PATIENT NAME: Danielle [name]    MR#:  [id number]  DATE OF BIRTH:  [birth year]  DATE OF ADMISSION:  04/02/2016 ADMITTING PHYSICIAN: Ihor Austin, MD  DATE OF DISCHARGE: 04/10/2016  PRIMARY CARE PHYSICIAN: No primary care provider on file.    ADMISSION DIAGNOSIS:  Dehydration [E86.0] UTI (lower urinary tract infection) [N39.0] Acute renal failure, unspecified acute renal failure type (HCC) [N17.9]  DISCHARGE DIAGNOSIS:  Principal Problem:   Dehydration Active Problems:   Acute renal failure (ARF) (HCC)   Acute renal failure (HCC)   SECONDARY DIAGNOSIS:   Past Medical History  Diagnosis Date  . GERD (gastroesophageal reflux disease)   . Osteoarthritis     HOSPITAL COURSE:   60 y.o. female with a known history of Osteoarthritis, GERD presented to the emergency room because of weakness and somnolence. For further details please further H&P.  1. Acute kidney injury: This is due to dehydration from poor by mouth intake and meloxicam. Creatinine has improved and has normalized. Appreciate nephrology consult.  2. Right hand cellulitis with MSSA sepsis: She was seen and evaluated by orthopedics. There is no evidence of abscess. Cellulitis is much improved. She will need 2 weeks of IV antibiotics. She has midline PICC placed. MRI lumbar spine showed no evidence of discitis. TEE showed no vegetations. Appreciate infectious disease consultation.  3. Leukocytosis: Patient's white blood cell count has remained anywhere between 22-28. Patient will need outpatient follow-up for this. She has had no fevers.  4. GERD: Continue PPI   DISCHARGE CONDITIONS AND DIET:   Patient will be discharged to skilled nursing facility on a regular diet  CONSULTS OBTAINED:  Treatment Team:  Kennedy Bucker, MD Mick Sell, MD Dalia Heading, MD  DRUG ALLERGIES:   Allergies  Allergen Reactions  . Compazine [Prochlorperazine  Edisylate] Nausea And Vomiting    DISCHARGE MEDICATIONS:   Current Discharge Medication List    START taking these medications   Details  acetaminophen (TYLENOL) 325 MG tablet Take 2 tablets (650 mg total) by mouth every 6 (six) hours as needed for mild pain (or Fever >/= 101). Qty: 30 tablet, Refills: 0    ceFAZolin (ANCEF) 2-4 GM/100ML-% IVPB Inject 100 mLs (2 g total) into the vein every 8 (eight) hours. Qty: 1 each, Refills: 0    oxyCODONE-acetaminophen (PERCOCET/ROXICET) 5-325 MG tablet Take 1-2 tablets by mouth every 6 (six) hours as needed for moderate pain or severe pain. Qty: 30 tablet, Refills: 0    polyethylene glycol (MIRALAX / GLYCOLAX) packet Take 17 g by mouth every other day. Qty: 14 each, Refills: 0      CONTINUE these medications which have NOT CHANGED   Details  albuterol (PROVENTIL HFA;VENTOLIN HFA) 108 (90 Base) MCG/ACT inhaler Inhale 2 puffs into the lungs every 6 (six) hours as needed for wheezing or shortness of breath.    ALPRAZolam (XANAX) 0.5 MG tablet Take 0.5 mg by mouth at bedtime as needed for anxiety.    desvenlafaxine (PRISTIQ) 50 MG 24 hr tablet Take 50 mg by mouth daily.    pantoprazole (PROTONIX) 40 MG tablet Take 40 mg by mouth daily.    tiZANidine (ZANAFLEX) 4 MG tablet Take 4 mg by mouth at bedtime.    traMADol (ULTRAM) 50 MG tablet Take 50 mg by mouth every 6 (six) hours as needed.      STOP taking these medications     meloxicam (MOBIC) 15 MG tablet  Today   CHIEF COMPLAINT:   Patient doing well this morning. No acute issues overnight. Right hand swelling is much improved.   VITAL SIGNS:  Blood pressure 125/58, pulse 88, temperature 98.5 F (36.9 C), temperature source Oral, resp. rate 20, height 5\' 1"  (1.549 m), weight 92.579 kg (204 lb 1.6 oz), SpO2 94 %.   REVIEW OF SYSTEMS:  Review of Systems  Constitutional: Negative for fever, chills and malaise/fatigue.  HENT: Negative for ear discharge, ear  pain, hearing loss, nosebleeds and sore throat.   Eyes: Negative for blurred vision and pain.  Respiratory: Negative for cough, hemoptysis, shortness of breath and wheezing.   Cardiovascular: Negative for chest pain, palpitations and leg swelling.  Gastrointestinal: Negative for nausea, vomiting, abdominal pain, diarrhea and blood in stool.  Genitourinary: Negative for dysuria.  Musculoskeletal: Negative for back pain.  Neurological: Negative for dizziness, tremors, speech change, focal weakness, seizures and headaches.  Endo/Heme/Allergies: Does not bruise/bleed easily.  Psychiatric/Behavioral: Negative for depression, suicidal ideas and hallucinations.     PHYSICAL EXAMINATION:  GENERAL:  60 y.o.-year-old patient lying in the bed with no acute distress.  NECK:  Supple, no jugular venous distention. No thyroid enlargement, no tenderness.  LUNGS: Normal breath sounds bilaterally, no wheezing, rales,rhonchi  No use of accessory muscles of respiration.  CARDIOVASCULAR: S1, S2 normal. No murmurs, rubs, or gallops.  ABDOMEN: Soft, non-tender, non-distended. Bowel sounds present. No organomegaly or mass.  EXTREMITIES: No pedal edema, cyanosis, or clubbing.  PSYCHIATRIC: The patient is alert and oriented x 3.  SKIN: Right hand with mild edema no evidence cellulitis or abscess    DATA REVIEW:   CBC  Recent Labs Lab 04/10/16 1034  WBC 25.0*  HGB 10.8*  HCT 32.2*  PLT 270    Chemistries   Recent Labs Lab 04/08/16 0458  NA 139  K 3.9  CL 109  CO2 23  GLUCOSE 93  BUN 27*  CREATININE 0.81  CALCIUM 8.4*    Cardiac Enzymes No results for input(s): TROPONINI in the last 168 hours.  Microbiology Results  @[email or web address] @  RADIOLOGY:  No results found.    Management plans discussed with the patient and she is in agreement. Stable for discharge SNF  Patient should follow up with Dr. Sampson GoonFitzgerald in one week   CODE STATUS:     Code Status Orders        Start      Ordered   04/03/16 0130  Full code   Continuous     04/03/16 0129    Code Status History    Date Active Date Inactive Code Status Order ID Comments User Context   This patient has a current code status but no historical code status.      TOTAL TIME TAKING CARE OF THIS PATIENT: 35 minutes.    Note: This dictation was prepared with Dragon dictation along with smaller phrase technology. Any transcriptional errors that result from this process are unintentional.  Chatara Lucente M.D on 04/10/2016 at 12:10 PM  Between 7am to 6pm - Pager - [phone] After 6pm go to [email or web address] - Social research officer, governmentpassword [email or web address]  [facility] [place] [facility]  Office  [phone]  CC: Primary care physician; No primary care provider on file.

## 2016-04-10 NOTE — Progress Notes (Signed)
RN called report to Marcelino DusterMichelle, [facility]. EMS called, Pt is waiting for transport. VSS.

## 2016-04-10 NOTE — Progress Notes (Signed)
Physical Therapy Treatment Patient Details Name: Danielle [name] MRN: [id number] DOB: 1956/10/02 Today's Date: 04/10/2016    History of Present Illness Danielle [name] is a 59 y.o. female with a known history of Osteoarthritis, GERD presented to the emergency room because of weakness and somnolence. Patient had been recently seeing the orthopedic doctor and also [facility] for low back pain and she is on pain medication and also completed her dose of oral steroids. Her last day of oral steroids for this today. According to the family members patient has been more lethargic and somnolent after being on pain medication. Patient is awake and alert and responds to all verbal commands in the emergency room today. Has history of dysuria. No history of any chest pain or shortness of breath. Has generalized weakness. Upon evaluation the emergency room she appeared to be dehydrated and also her creatinine was elevated. Patient was started on IV fluids and the received a IV antibiotic in the emergency room for possible urinary tract infection.No complaints of cough. No history or recent travel or sick contacts at home. Pt reports 1 fall in the last 12 month.    PT Comments    Pt continues to complain of R low back, R posterior hip, R hand, and L shoulder pain. She demonstrates good motivation and completes all exercises as instructed. Pt with increase in pain with bed mobility. Fair LE strength noted with sit to stand however unable to ambulate at this time with attempts. Pt reports severe RLE with stepping or attempted WB. Pt will benefit from skilled PT services to address deficits in strength, balance, and mobility in order to return to full function at home.    Follow Up Recommendations  SNF     Equipment Recommendations  Rolling walker with 5" wheels (may benefit from cane or hemiwalker, TBD at SNF)    Recommendations for Other Services       Precautions / Restrictions  Precautions Precautions: Fall Restrictions Weight Bearing Restrictions: No    Mobility  Bed Mobility Overal bed mobility: Needs Assistance Bed Mobility: Supine to Sit     Supine to sit: Mod assist     General bed mobility comments: Pt does reasonably well rolling onto R side. She cannot use her R hand to assist on bed rail secondary to pain. Requires modA+1 to go from R sidelying to sitting at EOB. Pt grimaces and yells due to R low back and R posterior hip pain. Pt also has difficulty reaching with LUE due to L shoulder pain  Transfers Overall transfer level: Needs assistance Equipment used: Rolling walker (2 wheeled) Transfers: Sit to/from Stand Sit to Stand: Min assist         General transfer comment: Pt does fair job with sit to stand reporting low back and R posterior hip pain but not significantly worsened. Unable to utilize R hand for gripping due to redness swelling. Attempted forward/backward stepping but pt unable to tolerate due to severe RLE pain with WB  Ambulation/Gait             General Gait Details: Attempted but unable to step or accept weight with RLE   Stairs            Wheelchair Mobility    Modified Rankin (Stroke Patients Only)       Balance Overall balance assessment: Needs assistance Sitting-balance support: Feet supported Sitting balance-Leahy Scale: Good     Standing balance support: Bilateral upper extremity supported Standing balance-Leahy Scale:  Poor                      Cognition Arousal/Alertness: Awake/alert Behavior During Therapy: Anxious Overall Cognitive Status: Within Functional Limits for tasks assessed                      Exercises General Exercises - Lower Extremity Ankle Circles/Pumps: Strengthening;Both;Supine;15 reps Quad Sets: Strengthening;Both;Supine;15 reps Gluteal Sets: Strengthening;Both;Supine;15 reps Long Arc Quad: Strengthening;Both;15 reps;Supine Heel Slides:  Strengthening;Both;Supine;15 reps Hip ABduction/ADduction: Strengthening;Both;Supine;15 reps Straight Leg Raises: Strengthening;Both;Supine;15 reps    General Comments        Pertinent Vitals/Pain Pain Assessment: 0-10 Pain Score: 5  Pain Location: R hip Pain Intervention(s): Limited activity within patient's tolerance;Monitored during session;Other (comment);Patient requesting pain meds-RN notified (Pt refused morphine, will get oxy at 11:30 per RN)    Home Living                      Prior Function            PT Goals (current goals can now be found in the care plan section) Acute Rehab PT Goals Patient Stated Goal: "I want to get better." PT Goal Formulation: With patient Time For Goal Achievement: 04/17/16 Potential to Achieve Goals: Good Progress towards PT goals: Progressing toward goals    Frequency  Min 2X/week    PT Plan Current plan remains appropriate    Co-evaluation             End of Session Equipment Utilized During Treatment: Gait belt Activity Tolerance: Patient limited by pain Patient left: with call bell/phone within reach;with family/visitor present;in bed;with bed alarm set;Other (comment) (Sitting at EOB)     Time: [id number] PT Time Calculation (min) (ACUTE ONLY): 24 min  Charges:  $Therapeutic Exercise: 8-22 mins $Therapeutic Activity: 8-22 mins                    G Codes:      Sharalyn InkJason D Eliza Green PT, DPT   Brycen Bean 04/10/2016, 12:13 PM

## 2016-04-10 NOTE — Consult Note (Signed)
Pharmacy Antibiotic Note  Danielle [name] is a 60 y.o. female admitted on 04/02/2016 with bacteremia.  Pharmacy has been consulted for cefazolin dosing.  Plan: BCID results: MSSA  Renal function improved with est CrCl~75 mL/min. Continue Cefazolin 2g IV q 8 hours. Pt will need at least 2 weeks of IV abx from neg blood culture per ID. TTE negative and TEE with no vegetations.  Blood cx taken 4/18 ngtd  Blood cx from 4/21 with Coag Negative Staph in anaerobic bottle only-results consistent with contamination   Height: 5\' 1"  (154.9 cm) Weight: 204 lb 1.6 oz (92.579 kg) IBW/kg (Calculated) : 47.8  Temp (24hrs), Avg:98.6 F (37 C), Min:98.5 F (36.9 C), Max:98.9 F (37.2 C)   Recent Labs Lab 04/04/16 0509 04/04/16 2206 04/06/16 0352 04/07/16 0435 04/07/16 1032 04/08/16 0458 04/09/16 0952  WBC 22.2* 25.0*  --  25.0*  --   --  28.2*  CREATININE 3.16* 2.63* 1.64*  --  1.02* 0.81  --     Estimated Creatinine Clearance: 75.6 mL/min (by C-G formula based on Cr of 0.81).    Allergies  Allergen Reactions  . Compazine [Prochlorperazine Edisylate] Nausea And Vomiting    Antimicrobials this admission: ceftriaxone 4/16 >> 4/17 cefazolin 4/17 >>   Dose adjustments this admission:   Microbiology results: Recent Results (from the past 240 hour(s))  Culture, blood (routine x 2)     Status: Abnormal   Collection Time: 04/02/16  7:16 PM  Result Value Ref Range Status   Specimen Description BLOOD LEFT ASSIST CONTROL  Final   Special Requests BOTTLES DRAWN AEROBIC AND ANAEROBIC 1CCAERO,1CCANA  Final   Culture  Setup Time   Final    GRAM POSITIVE COCCI IN BOTH AEROBIC AND ANAEROBIC BOTTLES CRITICAL VALUE NOTED.  VALUE IS CONSISTENT WITH PREVIOUSLY REPORTED AND CALLED VALUE.    Culture (A)  Final    STAPHYLOCOCCUS AUREUS IN BOTH AEROBIC AND ANAEROBIC BOTTLES    Report Status 04/05/2016 FINAL  Final   Organism ID, Bacteria STAPHYLOCOCCUS AUREUS  Final      Susceptibility   Staphylococcus aureus - MIC*    CIPROFLOXACIN <=0.5 SENSITIVE Sensitive     ERYTHROMYCIN <=0.25 SENSITIVE Sensitive     GENTAMICIN <=0.5 SENSITIVE Sensitive     OXACILLIN 0.5 SENSITIVE Sensitive     TETRACYCLINE 2 SENSITIVE Sensitive     VANCOMYCIN 1 SENSITIVE Sensitive     TRIMETH/SULFA <=10 SENSITIVE Sensitive     CLINDAMYCIN <=0.25 SENSITIVE Sensitive     RIFAMPIN <=0.5 SENSITIVE Sensitive     Inducible Clindamycin NEGATIVE Sensitive     * STAPHYLOCOCCUS AUREUS  Culture, blood (routine x 2)     Status: Abnormal   Collection Time: 04/02/16  7:16 PM  Result Value Ref Range Status   Specimen Description BLOOD RIGHT HAND  Final   Special Requests BOTTLES DRAWN AEROBIC AND ANAEROBIC 1CCAERO,1CCANA  Final   Culture  Setup Time   Final    GRAM POSITIVE COCCI IN BOTH AEROBIC AND ANAEROBIC BOTTLES CRITICAL RESULT CALLED TO, READ BACK BY AND VERIFIED WITH: KAREN HAYES AT 1338 ON 04/03/16 BY CTJ.    Culture (A)  Final    STAPHYLOCOCCUS AUREUS IN BOTH AEROBIC AND ANAEROBIC BOTTLES    Report Status 04/05/2016 FINAL  Final   Organism ID, Bacteria STAPHYLOCOCCUS AUREUS  Final      Susceptibility   Staphylococcus aureus - MIC*    CIPROFLOXACIN <=0.5 SENSITIVE Sensitive     ERYTHROMYCIN <=0.25 SENSITIVE Sensitive  GENTAMICIN <=0.5 SENSITIVE Sensitive     OXACILLIN 0.5 SENSITIVE Sensitive     TETRACYCLINE 2 SENSITIVE Sensitive     VANCOMYCIN 1 SENSITIVE Sensitive     TRIMETH/SULFA <=10 SENSITIVE Sensitive     CLINDAMYCIN <=0.25 SENSITIVE Sensitive     RIFAMPIN <=0.5 SENSITIVE Sensitive     Inducible Clindamycin NEGATIVE Sensitive     * STAPHYLOCOCCUS AUREUS  Urine culture     Status: None   Collection Time: 04/02/16  7:16 PM  Result Value Ref Range Status   Specimen Description URINE, RANDOM  Final   Special Requests NONE  Final   Culture MULTIPLE SPECIES PRESENT, SUGGEST RECOLLECTION  Final   Report Status 04/04/2016 FINAL  Final  Blood Culture ID Panel (Reflexed)     Status:  Abnormal   Collection Time: 04/02/16  7:16 PM  Result Value Ref Range Status   Enterococcus species NOT DETECTED NOT DETECTED Final   Vancomycin resistance NOT DETECTED NOT DETECTED Final   Listeria monocytogenes NOT DETECTED NOT DETECTED Final   Staphylococcus species DETECTED (A) NOT DETECTED Final    Comment: CRITICAL RESULT CALLED TO, READ BACK BY AND VERIFIED WITH: KAREN HAYES FOR STAPHYLOCOCCUS SPECIES AND STAPHYLOCOCCUS AUREUS AT 1338 ON 04/03/16 BY CTJ.    Staphylococcus aureus DETECTED (A) NOT DETECTED Final   Methicillin resistance NOT DETECTED NOT DETECTED Final   Streptococcus species NOT DETECTED NOT DETECTED Final   Streptococcus agalactiae NOT DETECTED NOT DETECTED Final   Streptococcus pneumoniae NOT DETECTED NOT DETECTED Final   Streptococcus pyogenes NOT DETECTED NOT DETECTED Final   Acinetobacter baumannii NOT DETECTED NOT DETECTED Final   Enterobacteriaceae species NOT DETECTED NOT DETECTED Final   Enterobacter cloacae complex NOT DETECTED NOT DETECTED Final   Escherichia coli NOT DETECTED NOT DETECTED Final   Klebsiella oxytoca NOT DETECTED NOT DETECTED Final   Klebsiella pneumoniae NOT DETECTED NOT DETECTED Final   Proteus species NOT DETECTED NOT DETECTED Final   Serratia marcescens NOT DETECTED NOT DETECTED Final   Carbapenem resistance NOT DETECTED NOT DETECTED Final   Haemophilus influenzae NOT DETECTED NOT DETECTED Final   Neisseria meningitidis NOT DETECTED NOT DETECTED Final   Pseudomonas aeruginosa NOT DETECTED NOT DETECTED Final   Candida albicans NOT DETECTED NOT DETECTED Final   Candida glabrata NOT DETECTED NOT DETECTED Final   Candida krusei NOT DETECTED NOT DETECTED Final   Candida parapsilosis NOT DETECTED NOT DETECTED Final   Candida tropicalis NOT DETECTED NOT DETECTED Final  Urine culture     Status: None   Collection Time: 04/04/16  4:44 PM  Result Value Ref Range Status   Specimen Description URINE, RANDOM  Final   Special Requests NONE   Final   Culture NO GROWTH 2 DAYS  Final   Report Status 04/06/2016 FINAL  Final  Culture, blood (Routine X 2) w Reflex to ID Panel     Status: None   Collection Time: 04/04/16 10:06 PM  Result Value Ref Range Status   Specimen Description BLOOD LEFT ANTECUBITAL  Final   Special Requests BOTTLES DRAWN AEROBIC AND ANAEROBIC  Final   Culture NO GROWTH 5 DAYS  Final   Report Status 04/09/2016 FINAL  Final  Culture, blood (single) w Reflex to PCR ID Panel     Status: None (Preliminary result)   Collection Time: 04/07/16 11:51 AM  Result Value Ref Range Status   Specimen Description BLOOD LEFT HAND  Final   Special Requests BOTTLES DRAWN AEROBIC AND ANAEROBIC  1CC  Final  Culture  Setup Time   Final    GRAM POSITIVE COCCI ANAEROBIC BOTTLE ONLY CRITICAL RESULT CALLED TO, READ BACK BY AND VERIFIED WITH: MATT MCBANE AT 0422 04/09/16.PMH CONFIRMED BY [facility]    Culture   Final    COAGULASE NEGATIVE STAPHYLOCOCCUS ANAEROBIC BOTTLE ONLY Results consistent with contamination.    Report Status PENDING  Incomplete  Blood Culture ID Panel (Reflexed)     Status: Abnormal   Collection Time: 04/07/16 11:51 AM  Result Value Ref Range Status   Enterococcus species NOT DETECTED NOT DETECTED Final   Vancomycin resistance NOT DETECTED NOT DETECTED Final   Listeria monocytogenes NOT DETECTED NOT DETECTED Final   Staphylococcus species DETECTED (A) NOT DETECTED Final    Comment: CRITICAL RESULT CALLED TO, READ BACK BY AND VERIFIED WITH: MATT MCBANE AT 0422 04/09/16.PMH    Staphylococcus aureus NOT DETECTED NOT DETECTED Final   Methicillin resistance DETECTED (A) NOT DETECTED Final    Comment: CRITICAL RESULT CALLED TO, READ BACK BY AND VERIFIED WITH: MATT MCBANE AT 0422 04/09/16.PMH    Streptococcus species NOT DETECTED NOT DETECTED Final   Streptococcus agalactiae NOT DETECTED NOT DETECTED Final   Streptococcus pneumoniae NOT DETECTED NOT DETECTED Final   Streptococcus pyogenes NOT DETECTED NOT  DETECTED Final   Acinetobacter baumannii NOT DETECTED NOT DETECTED Final   Enterobacteriaceae species NOT DETECTED NOT DETECTED Final   Enterobacter cloacae complex NOT DETECTED NOT DETECTED Final   Escherichia coli NOT DETECTED NOT DETECTED Final   Klebsiella oxytoca NOT DETECTED NOT DETECTED Final   Klebsiella pneumoniae NOT DETECTED NOT DETECTED Final   Proteus species NOT DETECTED NOT DETECTED Final   Serratia marcescens NOT DETECTED NOT DETECTED Final   Carbapenem resistance NOT DETECTED NOT DETECTED Final   Haemophilus influenzae NOT DETECTED NOT DETECTED Final   Neisseria meningitidis NOT DETECTED NOT DETECTED Final   Pseudomonas aeruginosa NOT DETECTED NOT DETECTED Final   Candida albicans NOT DETECTED NOT DETECTED Final   Candida glabrata NOT DETECTED NOT DETECTED Final   Candida krusei NOT DETECTED NOT DETECTED Final   Candida parapsilosis NOT DETECTED NOT DETECTED Final   Candida tropicalis NOT DETECTED NOT DETECTED Final     Thank you for allowing pharmacy to be a part of this patient's care.  Clarisa Schools, PharmD Clinical Pharmacist 04/10/2016

## 2016-04-10 NOTE — Progress Notes (Signed)
[place] INFECTIOUS DISE[facility] PROGRESS NOTE Date of Admission:  04/02/2016     ID: Danielle [name] is a 60 y.o. female with MSSA bacteremia, hand cellulitis, LBP Principal Problem:   Dehydration Active Problems:   Acute renal failure (ARF) (HCC)   Acute renal failure (HCC)  Subjective: No fevers, wbc remains elevated 25 but feels much ebtter Still with pain in back and legs   ROS  [employer] systems are reviewed and negative except per hpi  Medications:  Antibiotics Given (last 72 hours)    Date/Time Action Medication Dose Rate   04/07/16 1655 Given  [was on procedure at scheduled time.]   ceFAZolin (ANCEF) IVPB 2g/100 mL premix 2 g 200 mL/hr   04/07/16 2137 Given   ceFAZolin (ANCEF) IVPB 2g/100 mL premix 2 g 200 mL/hr   04/08/16 0655 Given   ceFAZolin (ANCEF) IVPB 2g/100 mL premix 2 g 200 mL/hr   04/08/16 1257 Given   ceFAZolin (ANCEF) IVPB 2g/100 mL premix 2 g 200 mL/hr   04/08/16 2221 Given   ceFAZolin (ANCEF) IVPB 2g/100 mL premix 2 g 200 mL/hr   04/09/16 7494 Given   ceFAZolin (ANCEF) IVPB 2g/100 mL premix 2 g 200 mL/hr   04/09/16 1501 Given   ceFAZolin (ANCEF) IVPB 2g/100 mL premix 2 g 200 mL/hr   04/09/16 2150 Given   ceFAZolin (ANCEF) IVPB 2g/100 mL premix 2 g 200 mL/hr   04/10/16 0532 Given   ceFAZolin (ANCEF) IVPB 2g/100 mL premix 2 g 200 mL/hr     .  ceFAZolin (ANCEF) IV  2 g Intravenous Q8H  . enoxaparin (LOVENOX) injection  40 mg Subcutaneous Q24H  . guaiFENesin  600 mg Oral BID  . pantoprazole  40 mg Oral QAC breakfast  . polyethylene glycol  17 g Oral QODAY  . senna-docusate  1 tablet Oral BID  . sodium chloride  500 mL Intravenous Once  . tiZANidine  4 mg Oral QHS  . venlafaxine XR  37.5 mg Oral Q breakfast    Objective: Vital signs in last 24 hours: Temp:  [98.5 F (36.9 C)-98.9 F (37.2 C)] 98.5 F (36.9 C) (04/24 0612) Pulse Rate:  [87-88] 88 (04/24 0612) Resp:  [20] 20 (04/24 0612) BP: (125-139)/(55-70) 125/58 mmHg (04/24 0612) SpO2:   [94 %-96 %] 94 % (04/24 0612) Constitutional: oriented to person, place, and time. appears well-developed and well-nourished. No distress.  HENT: [place]/AT, PERRLA, no scleral icterus Mouth/Throat: Oropharynx is clear and moist. No oropharyngeal exudate.  Cardiovascular: Normal rate, regular rhythm 2/6 sm  Pulmonary/Chest: ctab Neck = supple, no nuchal rigidity Abdominal: Soft. Bowel sounds are normal. exhibits no distension. There is no tenderness.  Lymphadenopathy: no cervical adenopathy. No axillary adenopathy Neurological: alert and oriented to person, place, and time.  Skin: R dorsum of hand with minimal swelling, midl ttp , warmth.  Psychiatric: a normal mood and affect. behavior is normal.   Lab Results  Recent Labs  04/08/16 0458 04/09/16 0952 04/10/16 1034  WBC  --  28.2* 25.0*  HGB  --  11.2* 10.8*  HCT  --  33.4* 32.2*  NA 139  --   --   K 3.9  --   --   CL 109  --   --   CO2 23  --   --   BUN 27*  --   --   CREATININE 0.81  --   --     Microbiology: Results for orders placed or performed during the hospital encounter of  04/02/16  Culture, blood (routine x 2)     Status: Abnormal   Collection Time: 04/02/16  7:16 PM  Result Value Ref Range Status   Specimen Description BLOOD LEFT ASSIST CONTROL  Final   Special Requests BOTTLES DRAWN AEROBIC AND ANAEROBIC [place]  Final   Culture  Setup Time   Final    GRAM POSITIVE COCCI IN BOTH AEROBIC AND ANAEROBIC BOTTLES CRITICAL VALUE NOTED.  VALUE IS CONSISTENT WITH PREVIOUSLY REPORTED AND CALLED VALUE.    Culture (A)  Final    STAPHYLOCOCCUS AUREUS IN BOTH AEROBIC AND ANAEROBIC BOTTLES    Report Status 04/05/2016 FINAL  Final   Organism ID, Bacteria STAPHYLOCOCCUS AUREUS  Final      Susceptibility   Staphylococcus aureus - MIC*    CIPROFLOXACIN <=0.5 SENSITIVE Sensitive     ERYTHROMYCIN <=0.25 SENSITIVE Sensitive     GENTAMICIN <=0.5 SENSITIVE Sensitive     OXACILLIN 0.5 SENSITIVE Sensitive      TETRACYCLINE 2 SENSITIVE Sensitive     VANCOMYCIN 1 SENSITIVE Sensitive     TRIMETH/SULFA <=10 SENSITIVE Sensitive     CLINDAMYCIN <=0.25 SENSITIVE Sensitive     RIFAMPIN <=0.5 SENSITIVE Sensitive     Inducible Clindamycin NEGATIVE Sensitive     * STAPHYLOCOCCUS AUREUS  Culture, blood (routine x 2)     Status: Abnormal   Collection Time: 04/02/16  7:16 PM  Result Value Ref Range Status   Specimen Description BLOOD RIGHT HAND  Final   Special Requests BOTTLES DRAWN AEROBIC AND ANAEROBIC [place]  Final   Culture  Setup Time   Final    GRAM POSITIVE COCCI IN BOTH AEROBIC AND ANAEROBIC BOTTLES CRITICAL RESULT CALLED TO, READ BACK BY AND VERIFIED WITH: KAREN HAYES AT 4782 ON 04/03/16 BY CTJ.    Culture (A)  Final    STAPHYLOCOCCUS AUREUS IN BOTH AEROBIC AND ANAEROBIC BOTTLES    Report Status 04/05/2016 FINAL  Final   Organism ID, Bacteria STAPHYLOCOCCUS AUREUS  Final      Susceptibility   Staphylococcus aureus - MIC*    CIPROFLOXACIN <=0.5 SENSITIVE Sensitive     ERYTHROMYCIN <=0.25 SENSITIVE Sensitive     GENTAMICIN <=0.5 SENSITIVE Sensitive     OXACILLIN 0.5 SENSITIVE Sensitive     TETRACYCLINE 2 SENSITIVE Sensitive     VANCOMYCIN 1 SENSITIVE Sensitive     TRIMETH/SULFA <=10 SENSITIVE Sensitive     CLINDAMYCIN <=0.25 SENSITIVE Sensitive     RIFAMPIN <=0.5 SENSITIVE Sensitive     Inducible Clindamycin NEGATIVE Sensitive     * STAPHYLOCOCCUS AUREUS  Urine culture     Status: None   Collection Time: 04/02/16  7:16 PM  Result Value Ref Range Status   Specimen Description URINE, RANDOM  Final   Special Requests NONE  Final   Culture MULTIPLE SPECIES PRESENT, SUGGEST RECOLLECTION  Final   Report Status 04/04/2016 FINAL  Final  Blood Culture ID Panel (Reflexed)     Status: Abnormal   Collection Time: 04/02/16  7:16 PM  Result Value Ref Range Status   Enterococcus species NOT DETECTED NOT DETECTED Final   Vancomycin resistance NOT DETECTED NOT DETECTED Final   Listeria  monocytogenes NOT DETECTED NOT DETECTED Final   Staphylococcus species DETECTED (A) NOT DETECTED Final    Comment: CRITICAL RESULT CALLED TO, READ BACK BY AND VERIFIED WITH: KAREN HAYES FOR STAPHYLOCOCCUS SPECIES AND STAPHYLOCOCCUS AUREUS AT 1338 ON 04/03/16 BY CTJ.    Staphylococcus aureus DETECTED (A) NOT DETECTED Final   Methicillin resistance NOT  DETECTED NOT DETECTED Final   Streptococcus species NOT DETECTED NOT DETECTED Final   Streptococcus agalactiae NOT DETECTED NOT DETECTED Final   Streptococcus pneumoniae NOT DETECTED NOT DETECTED Final   Streptococcus pyogenes NOT DETECTED NOT DETECTED Final   Acinetobacter baumannii NOT DETECTED NOT DETECTED Final   Enterobacteriaceae species NOT DETECTED NOT DETECTED Final   Enterobacter cloacae complex NOT DETECTED NOT DETECTED Final   Escherichia coli NOT DETECTED NOT DETECTED Final   Klebsiella oxytoca NOT DETECTED NOT DETECTED Final   Klebsiella pneumoniae NOT DETECTED NOT DETECTED Final   Proteus species NOT DETECTED NOT DETECTED Final   Serratia marcescens NOT DETECTED NOT DETECTED Final   Carbapenem resistance NOT DETECTED NOT DETECTED Final   Haemophilus influenzae NOT DETECTED NOT DETECTED Final   Neisseria meningitidis NOT DETECTED NOT DETECTED Final   Pseudomonas aeruginosa NOT DETECTED NOT DETECTED Final   Candida albicans NOT DETECTED NOT DETECTED Final   Candida glabrata NOT DETECTED NOT DETECTED Final   Candida krusei NOT DETECTED NOT DETECTED Final   Candida parapsilosis NOT DETECTED NOT DETECTED Final   Candida tropicalis NOT DETECTED NOT DETECTED Final  Urine culture     Status: None   Collection Time: 04/04/16  4:44 PM  Result Value Ref Range Status   Specimen Description URINE, RANDOM  Final   Special Requests NONE  Final   Culture NO GROWTH 2 DAYS  Final   Report Status 04/06/2016 FINAL  Final  Culture, blood (Routine X 2) w Reflex to ID Panel     Status: None   Collection Time: 04/04/16 10:06 PM  Result Value  Ref Range Status   Specimen Description BLOOD LEFT ANTECUBITAL  Final   Special Requests BOTTLES DRAWN AEROBIC AND ANAEROBIC 5ML  Final   Culture NO GROWTH 5 DAYS  Final   Report Status 04/09/2016 FINAL  Final  Culture, blood (single) w Reflex to PCR ID Panel     Status: None (Preliminary result)   Collection Time: 04/07/16 11:51 AM  Result Value Ref Range Status   Specimen Description BLOOD LEFT HAND  Final   Special Requests BOTTLES DRAWN AEROBIC AND ANAEROBIC  1CC  Final   Culture  Setup Time   Final    GRAM POSITIVE COCCI ANAEROBIC BOTTLE ONLY CRITICAL RESULT CALLED TO, READ BACK BY AND VERIFIED WITH: MATT MCBANE AT 0422 04/09/16.PMH CONFIRMED BY [facility]    Culture   Final    COAGULASE NEGATIVE STAPHYLOCOCCUS ANAEROBIC BOTTLE ONLY Results consistent with contamination.    Report Status PENDING  Incomplete  Blood Culture ID Panel (Reflexed)     Status: Abnormal   Collection Time: 04/07/16 11:51 AM  Result Value Ref Range Status   Enterococcus species NOT DETECTED NOT DETECTED Final   Vancomycin resistance NOT DETECTED NOT DETECTED Final   Listeria monocytogenes NOT DETECTED NOT DETECTED Final   Staphylococcus species DETECTED (A) NOT DETECTED Final    Comment: CRITICAL RESULT CALLED TO, READ BACK BY AND VERIFIED WITH: MATT MCBANE AT 0422 04/09/16.PMH    Staphylococcus aureus NOT DETECTED NOT DETECTED Final   Methicillin resistance DETECTED (A) NOT DETECTED Final    Comment: CRITICAL RESULT CALLED TO, READ BACK BY AND VERIFIED WITH: MATT MCBANE AT 0422 04/09/16.PMH    Streptococcus species NOT DETECTED NOT DETECTED Final   Streptococcus agalactiae NOT DETECTED NOT DETECTED Final   Streptococcus pneumoniae NOT DETECTED NOT DETECTED Final   Streptococcus pyogenes NOT DETECTED NOT DETECTED Final   Acinetobacter baumannii NOT DETECTED NOT DETECTED Final  Enterobacteriaceae species NOT DETECTED NOT DETECTED Final   Enterobacter cloacae complex NOT DETECTED NOT DETECTED Final    Escherichia coli NOT DETECTED NOT DETECTED Final   Klebsiella oxytoca NOT DETECTED NOT DETECTED Final   Klebsiella pneumoniae NOT DETECTED NOT DETECTED Final   Proteus species NOT DETECTED NOT DETECTED Final   Serratia marcescens NOT DETECTED NOT DETECTED Final   Carbapenem resistance NOT DETECTED NOT DETECTED Final   Haemophilus influenzae NOT DETECTED NOT DETECTED Final   Neisseria meningitidis NOT DETECTED NOT DETECTED Final   Pseudomonas aeruginosa NOT DETECTED NOT DETECTED Final   Candida albicans NOT DETECTED NOT DETECTED Final   Candida glabrata NOT DETECTED NOT DETECTED Final   Candida krusei NOT DETECTED NOT DETECTED Final   Candida parapsilosis NOT DETECTED NOT DETECTED Final   Candida tropicalis NOT DETECTED NOT DETECTED Final    Studies/Results: No results found. Echo Study Conclusions  - Left ventricle: The cavity size was normal. Systolic function was  normal. The estimated ejection fraction was in the range of 60%  to 65%. Wall motion was normal; there were no regional wall  motion abnormalities. Left ventricular diastolic function  parameters were normal. - Mitral valve: There was mild regurgitation. - Left atrium: The atrium was normal in size. - Right ventricle: Systolic function was normal. - Pulmonary arteries: Systolic pressure was mildly elevated. PA  peak pressure: 43 mm Hg (S).  Impressions:  - No valve vegetation noted.  Assessment/Plan: Assessment:  Danielle [name] is a 60 y.o. female with MSSA bacteremia, R wrist swelling and cellulitis (possible septic arthritis) and LBP (concern for discitis but MRI 4/12 at [facility] negative). Also some L shoulder pain  ESR 109, HIV neg, CRP P,  FU Bcx 4/18 ngtd ECHO TTE - no veg, mild MR WBC remains elevated at 25 but wrist much better. MRI negative on repeat.   Recommendations Continue Ancef  Will need 2 weeks IV abx from first negative culture - see abx order sheet Thank you very much for the  consult. Will follow with you.  FITZGERALD, DAVID P   04/10/2016, 1:45 PM

## 2016-04-11 DIAGNOSIS — A4101 Sepsis due to Methicillin susceptible Staphylococcus aureus: Secondary | ICD-10-CM | POA: Diagnosis not present

## 2016-04-12 LAB — CULTURE, BLOOD (SINGLE)

## 2016-04-12 LAB — GLUCOSE, CAPILLARY: Glucose-Capillary: 104 mg/dL — ABNORMAL HIGH (ref 65–99)

## 2016-04-13 ENCOUNTER — Other Ambulatory Visit: Payer: Self-pay | Admitting: Internal Medicine

## 2016-04-13 DIAGNOSIS — M25552 Pain in left hip: Secondary | ICD-10-CM

## 2016-04-13 DIAGNOSIS — A4101 Sepsis due to Methicillin susceptible Staphylococcus aureus: Secondary | ICD-10-CM | POA: Diagnosis not present

## 2016-04-13 LAB — COMPREHENSIVE METABOLIC PANEL
ALBUMIN: 2.3 g/dL — AB (ref 3.5–5.0)
ALT: 15 U/L (ref 14–54)
AST: 27 U/L (ref 15–41)
Alkaline Phosphatase: 126 U/L (ref 38–126)
Anion gap: 7 (ref 5–15)
BUN: 16 mg/dL (ref 6–20)
CHLORIDE: 103 mmol/L (ref 101–111)
CO2: 26 mmol/L (ref 22–32)
CREATININE: 0.61 mg/dL (ref 0.44–1.00)
Calcium: 8.9 mg/dL (ref 8.9–10.3)
GFR calc non Af Amer: >60 mL/min (ref >60)
Glucose, Bld: 85 mg/dL (ref 65–99)
POTASSIUM: 3.8 mmol/L (ref 3.5–5.1)
SODIUM: 136 mmol/L (ref 135–145)
Total Bilirubin: 0.8 mg/dL (ref 0.3–1.2)
Total Protein: 6.5 g/dL (ref 6.5–8.1)

## 2016-04-13 LAB — CBC WITH DIFFERENTIAL/PLATELET
BASOS ABS: 0.1 10*3/uL (ref 0–0.1)
BASOS PCT: 0 %
EOS ABS: 0.4 10*3/uL (ref 0–0.7)
EOS PCT: 3 %
HCT: 26.7 % — ABNORMAL LOW (ref 35.0–47.0)
HEMOGLOBIN: 8.9 g/dL — AB (ref 12.0–16.0)
Lymphocytes Relative: 9 %
Lymphs Abs: 1.3 10*3/uL (ref 1.0–3.6)
MCH: 28.8 pg (ref 26.0–34.0)
MCHC: 33.4 g/dL (ref 32.0–36.0)
MCV: 86.3 fL (ref 80.0–100.0)
Monocytes Absolute: 1.1 10*3/uL — ABNORMAL HIGH (ref 0.2–0.9)
Monocytes Relative: 8 %
NEUTROS PCT: 80 %
Neutro Abs: 11.1 10*3/uL — ABNORMAL HIGH (ref 1.4–6.5)
PLATELETS: 469 10*3/uL — AB (ref 150–440)
RBC: 3.1 MIL/uL — AB (ref 3.80–5.20)
RDW: 14.7 % — ABNORMAL HIGH (ref 11.5–14.5)
WBC: 13.9 10*3/uL — AB (ref 3.6–11.0)

## 2016-04-13 LAB — SEDIMENTATION RATE: Sed Rate: 126 mm/hr — ABNORMAL HIGH (ref 0–30)

## 2016-04-13 LAB — C-REACTIVE PROTEIN: CRP: 17.3 mg/dL — AB (ref <1.0)

## 2016-04-17 ENCOUNTER — Encounter
Admission: RE | Admit: 2016-04-17 | Discharge: 2016-04-17 | Disposition: A | Discharge status: Home / Self Care | Payer: Commercial Managed Care - PPO | Source: Ambulatory Visit | Attending: Internal Medicine | Admitting: Internal Medicine

## 2016-04-18 ENCOUNTER — Other Ambulatory Visit: Payer: Self-pay | Admitting: Gerontology

## 2016-04-18 ENCOUNTER — Ambulatory Visit
Admission: RE | Admit: 2016-04-18 | Discharge: 2016-04-18 | Disposition: A | Discharge status: Home / Self Care | Payer: Commercial Managed Care - PPO | Source: Ambulatory Visit | Attending: Internal Medicine | Admitting: Internal Medicine

## 2016-04-18 DIAGNOSIS — M25451 Effusion, right hip: Secondary | ICD-10-CM | POA: Insufficient documentation

## 2016-04-18 DIAGNOSIS — M25552 Pain in left hip: Secondary | ICD-10-CM

## 2016-04-18 DIAGNOSIS — R937 Abnormal findings on diagnostic imaging of other parts of musculoskeletal system: Secondary | ICD-10-CM | POA: Insufficient documentation

## 2016-04-18 DIAGNOSIS — M25551 Pain in right hip: Secondary | ICD-10-CM | POA: Diagnosis not present

## 2016-04-18 DIAGNOSIS — G8929 Other chronic pain: Secondary | ICD-10-CM | POA: Diagnosis present

## 2017-03-21 ENCOUNTER — Emergency Department: Payer: Commercial Managed Care - PPO

## 2017-03-21 ENCOUNTER — Emergency Department
Admission: EM | Admit: 2017-03-21 | Discharge: 2017-03-21 | Disposition: A | Discharge status: Home / Self Care | Payer: Commercial Managed Care - PPO | Attending: Emergency Medicine | Admitting: Emergency Medicine

## 2017-03-21 ENCOUNTER — Encounter: Payer: Self-pay | Admitting: *Deleted

## 2017-03-21 DIAGNOSIS — R350 Frequency of micturition: Secondary | ICD-10-CM | POA: Insufficient documentation

## 2017-03-21 DIAGNOSIS — J0140 Acute pansinusitis, unspecified: Secondary | ICD-10-CM | POA: Diagnosis not present

## 2017-03-21 DIAGNOSIS — R110 Nausea: Secondary | ICD-10-CM

## 2017-03-21 DIAGNOSIS — M549 Dorsalgia, unspecified: Secondary | ICD-10-CM

## 2017-03-21 DIAGNOSIS — R519 Headache, unspecified: Secondary | ICD-10-CM

## 2017-03-21 DIAGNOSIS — Z79899 Other long term (current) drug therapy: Secondary | ICD-10-CM | POA: Insufficient documentation

## 2017-03-21 DIAGNOSIS — M545 Low back pain: Secondary | ICD-10-CM | POA: Insufficient documentation

## 2017-03-21 DIAGNOSIS — R51 Headache: Secondary | ICD-10-CM | POA: Diagnosis present

## 2017-03-21 LAB — URINALYSIS, COMPLETE (UACMP) WITH MICROSCOPIC
BACTERIA UA: NONE SEEN
Bilirubin Urine: NEGATIVE
Glucose, UA: NEGATIVE mg/dL
Ketones, ur: NEGATIVE mg/dL
Leukocytes, UA: NEGATIVE
Nitrite: NEGATIVE
PROTEIN: NEGATIVE mg/dL
Specific Gravity, Urine: 1.025 (ref 1.005–1.030)
pH: 5 (ref 5.0–8.0)

## 2017-03-21 LAB — CBC
HEMATOCRIT: 38.3 % (ref 35.0–47.0)
HEMOGLOBIN: 12.9 g/dL (ref 12.0–16.0)
MCH: 29.8 pg (ref 26.0–34.0)
MCHC: 33.7 g/dL (ref 32.0–36.0)
MCV: 88.5 fL (ref 80.0–100.0)
Platelets: 218 10*3/uL (ref 150–440)
RBC: 4.33 MIL/uL (ref 3.80–5.20)
RDW: 14.8 % — ABNORMAL HIGH (ref 11.5–14.5)
WBC: 5.3 10*3/uL (ref 3.6–11.0)

## 2017-03-21 LAB — COMPREHENSIVE METABOLIC PANEL
ALT: 20 U/L (ref 14–54)
ANION GAP: 6 (ref 5–15)
AST: 19 U/L (ref 15–41)
Albumin: 4.3 g/dL (ref 3.5–5.0)
Alkaline Phosphatase: 75 U/L (ref 38–126)
BUN: 25 mg/dL — ABNORMAL HIGH (ref 6–20)
CALCIUM: 8.9 mg/dL (ref 8.9–10.3)
CO2: 25 mmol/L (ref 22–32)
Chloride: 108 mmol/L (ref 101–111)
Creatinine, Ser: 0.71 mg/dL (ref 0.44–1.00)
GFR calc Af Amer: >60 mL/min (ref >60)
GFR calc non Af Amer: >60 mL/min (ref >60)
GLUCOSE: 94 mg/dL (ref 65–99)
Potassium: 3.9 mmol/L (ref 3.5–5.1)
Sodium: 139 mmol/L (ref 135–145)
Total Bilirubin: 0.6 mg/dL (ref 0.3–1.2)
Total Protein: 7.3 g/dL (ref 6.5–8.1)

## 2017-03-21 LAB — LIPASE, BLOOD: LIPASE: 21 U/L (ref 11–51)

## 2017-03-21 LAB — TROPONIN I

## 2017-03-21 MED ORDER — ONDANSETRON HCL 4 MG PO TABS: 4.0000 mg | ORAL_TABLET | Freq: Every day | ORAL | 0 refills | Status: AC | PRN

## 2017-03-21 MED ORDER — ONDANSETRON HCL 4 MG/2ML IJ SOLN
4.0000 mg | Freq: Once | INTRAMUSCULAR | Status: AC
  Administered 2017-03-21: 4 mg via INTRAVENOUS
  Filled 2017-03-21: qty 2

## 2017-03-21 NOTE — ED Notes (Signed)
Patient is back from CT scan.

## 2017-03-21 NOTE — ED Provider Notes (Signed)
[facility] Emergency Department Provider Note ____________________________________________   I have reviewed the triage vital signs and the triage nursing note.  HISTORY  Chief Complaint Emesis   Historian Patient  HPI Danielle [name] is a 61 y.o. female with history of recently being treated for uti with doxycyline per patient to cover for mrsa given the history of what sounds like sepsis and later diagnosed with "back infection" about a year ago. In any case Thursday or Friday she was evaluated by her doctor for urinary symptoms and lower abdominal pain as well as back pain and started on antibiotic. On Monday she states that she developed a fever and had some nausea and vomiting and continued back pain.    Today she went to [facility] clinic walk-in and was referred to the ED for further evaluation.  Denies focal weakness or numbness. Denies confusion or altered mental status. She does report the last few days she's had a mild global headache. She feels like she had some floaters or sparkles in her vision yesterday but today vision spine.  She said nausea without chest pain or trouble breathing. She's continued to have some lower abdominal and low back pain. She states that some of the low back pain goes down both legs. It feels similar to her when she was finally diagnosed with "back infection. "    Past Medical History:  Diagnosis Date  . GERD (gastroesophageal reflux disease)   . Osteoarthritis     Patient Active Problem List   Diagnosis Date Noted  . Acute renal failure (HCC) 04/03/2016  . Dehydration 04/02/2016  . Acute renal failure (ARF) (HCC) 04/02/2016    Past Surgical History:  Procedure Laterality Date  . BREAST EXCISIONAL BIOPSY Left    1981 cyst removed  . OVARIAN CYST SURGERY    . TEE WITHOUT CARDIOVERSION N/A 04/07/2016   Procedure: TRANSESOPHAGEAL ECHOCARDIOGRAM (TEE);  Surgeon: Dalia Heading, MD;  Location: [facility] ORS;  Service:  Cardiovascular;  Laterality: N/A;    Prior to Admission medications   Medication Sig Start Date End Date Taking? Authorizing Provider  desvenlafaxine (PRISTIQ) 50 MG 24 hr tablet Take 50 mg by mouth daily.   Yes Historical Provider, MD  doxycycline (VIBRA-TABS) 100 MG tablet Take 100 mg by mouth 2 (two) times daily. 03/15/17 03/25/17 Yes Historical Provider, MD  meloxicam (MOBIC) 15 MG tablet Take 15 mg by mouth daily. 10/16/16  Yes Historical Provider, MD  tiZANidine (ZANAFLEX) 4 MG tablet Take 4 mg by mouth at bedtime.   Yes Historical Provider, MD  acetaminophen (TYLENOL) 325 MG tablet Take 2 tablets (650 mg total) by mouth every 6 (six) hours as needed for mild pain (or Fever >/= 101). Patient not taking: Reported on 03/21/2017 04/10/16   Adrian Saran, MD  oxyCODONE-acetaminophen (PERCOCET/ROXICET) 5-325 MG tablet Take 1-2 tablets by mouth every 6 (six) hours as needed for moderate pain or severe pain. Patient not taking: Reported on 03/21/2017 04/10/16   Adrian Saran, MD  polyethylene glycol (MIRALAX / GLYCOLAX) packet Take 17 g by mouth every other day. Patient not taking: Reported on 03/21/2017 04/10/16   Adrian Saran, MD    Allergies  Allergen Reactions  . Compazine [Prochlorperazine Edisylate] Nausea And Vomiting  . Morphine And Related Nausea Only    Family History  Problem Relation Age of Onset  . Breast cancer Neg Hx     Social History Social History  Substance Use Topics  . Smoking status: Never Smoker  . Smokeless  tobacco: Not on file  . Alcohol use No    Review of Systems  Constitutional: A fever 2 days ago and none since. Eyes: Floaters yesterday, but no ongoing vision changes. ENT: Negative for sore throat. Cardiovascular: Negative for chest pain. Respiratory: Negative for shortness of breath. Gastrointestinal: Negative for diarrhea. Genitourinary: Positive for frequency of urination without dysuria.. Musculoskeletal: Positive for low back pain., Bilateral. Skin: Negative  for rash. Neurological: Positive for global headache for a few days now.  10 point Review of Systems otherwise negative ____________________________________________   PHYSICAL EXAM:  VITAL SIGNS: ED Triage Vitals  Enc Vitals Group     BP 03/21/17 0855 120/71     Pulse Rate 03/21/17 0855 70     Resp 03/21/17 0855 18     Temp 03/21/17 0855 98.6 F (37 C)     Temp Source 03/21/17 0855 Oral     SpO2 03/21/17 0855 99 %     Weight 03/21/17 0855 198 lb (89.8 kg)     Height 03/21/17 0855  (1.549 m)     Head Circumference --      Peak Flow --      Pain Score 03/21/17 0854 3     Pain Loc --      Pain Edu? --      Excl. in GC? --      Constitutional: Alert and oriented. Well appearing and in no distress. HEENT   Head: Normocephalic and atraumatic.      Eyes: Conjunctivae are normal. PERRL. Normal extraocular movements.      Ears:         Nose: No congestion/rhinnorhea.   Mouth/Throat: Mucous membranes are moist.   Neck: No stridor.  Neck nontender. Neck supple. Cardiovascular/Chest: Normal rate, regular rhythm.  No murmurs, rubs, or gallops. Respiratory: Normal respiratory effort without tachypnea nor retractions. Breath sounds are clear and equal bilaterally. No wheezes/rales/rhonchi. Gastrointestinal: Soft. No distention, no guarding, no rebound. Mild minimal lower abdominal discomfort palpation. Obese.  Genitourinary/rectal:Deferred Musculoskeletal: Nontender with normal range of motion in all extremities. No joint effusions.  No lower extremity tenderness.  No edema.  Mild tenderness in the low back without focal point tenderness across the spine. Neurologic:  Normal speech and language. No gross or focal neurologic deficits are appreciated. Skin:  Skin is warm, dry and intact. No rash noted. Psychiatric: Mood and affect are normal. Speech and behavior are normal. Patient exhibits appropriate insight and  judgment.   ____________________________________________  LABS (pertinent positives/negatives)  Labs Reviewed  COMPREHENSIVE METABOLIC PANEL - Abnormal; Notable for the following:       Result Value   BUN 25 (*)    All other components within normal limits  CBC - Abnormal; Notable for the following:    RDW 14.8 (*)    All other components within normal limits  URINALYSIS, COMPLETE (UACMP) WITH MICROSCOPIC - Abnormal; Notable for the following:    Color, Urine YELLOW (*)    APPearance CLEAR (*)    Hgb urine dipstick SMALL (*)    Squamous Epithelial / LPF 0-5 (*)    All other components within normal limits  CULTURE, BLOOD (ROUTINE X 2)  CULTURE, BLOOD (ROUTINE X 2)  URINE CULTURE  LIPASE, BLOOD  TROPONIN I    ____________________________________________    EKG I, Governor Rooks, MD, the attending physician have personally viewed and interpreted all ECGs.  59 bpm. No sinus rhythm. Narrow QRS. Normal axis. Normal ST and T-wave ____________________________________________  RADIOLOGY All  Xrays were viewed by me. Imaging interpreted by Radiologist.  CT head without contrast:  IMPRESSION: Mild frontal atrophy bilaterally. No intracranial mass, hemorrhage, or extra-axial fluid collection. Mild periventricular and mid pontine small vessel disease. No acute appearing infarct. Mild vascular calcification noted bilaterally. Opacification of anterior ethmoid air cells on the right consistent with paranasal sinus disease in this region.  MRI sacrum without contrast:  Pending. __________________________________________  PROCEDURES  Procedure(s) performed: None  Critical Care performed: None  ____________________________________________   ED COURSE / ASSESSMENT AND PLAN  Pertinent labs & imaging results that were available during my care of the patient were reviewed by me and considered in my medical decision making (see chart for details).   Patient's constallation  of symptoms patient was somewhat concerned for worsening UTI despite treatment on doxycycline. Psych and seems a bit of an odd choice, but she states that was to cover possible MRSA given a history of prior MRSA infection.  Urinalysis is not consistent with active UTI.  White blood cell count normal. Electrolytes and  kidney function are normal.  She is persistently nauseated, I will obtain EKG and added on troponin.  She is also complaining of low-grade global headache which is a little unusual for her as well as floaters. I discussed with her obtaining head CT.  Return to the low back pain, no new traumatic injury. No focal neurologic deficits.  I reviewed 04/18/16 MR the right hip which showed bilateral septic sacroiliitis and osteomyelitis.  Given this history and patient states she feels similarly, I am going to order MRI.  She has no elevated wbc today, but has been on doxycyline, could be masking/partially treated.  CT head showing paranasal sinus disease.  On doxycycline, suspect partly responsible for her sinus drainage complaints.  Patient does question if maybe she just had some sort of viral illness. I discussed with her that it's possible, the rest of her evaluation are reassuring today. MRI is still pending.  Patient care to be transferred to Dr. Pershing Proud at shift change 3pm.  MRI pending.  If reassuring, suspect discharge home.  If negative, patient will be discharged by prepared discharge instructions.  CONSULTATIONS:   None   Patient / Family / Caregiver informed of clinical course, medical decision-making process, and agree with plan.   I discussed return precautions, follow-up instructions, and discharge instructions with patient and/or family.  Discharge instructions:  You were evaluated for headache, nausea, and low back pain, and although no certain cause was found, your exam and evaluation are reassuring in the emergency department stay.  Return to the ER  immediately for any worsening pain, weakness, numbness, vomiting, chest pain or trouble breathing, fever, or any other symptoms concerning for you. Please follow-up with your  primary care doctor closely, this week possible.  ___________________________________________   FINAL CLINICAL IMPRESSION(S) / ED DIAGNOSES   Final diagnoses:  Nausea  Headache, unspecified headache type  Back pain  Acute pansinusitis, recurrence not specified              Note: This dictation was prepared with Dragon dictation. Any transcriptional errors that result from this process are unintentional    Governor Rooks, MD 03/21/17 1453

## 2017-03-21 NOTE — ED Notes (Signed)
Patient in [place].  Explained wait time.  No new complaints.  States she still has a headache at this time.

## 2017-03-21 NOTE — ED Notes (Signed)
Patient ambulated to room commode with a steady gait. 

## 2017-03-21 NOTE — Discharge Instructions (Signed)
From Dr. Shaune Pollack:  You were evaluated for headache, nausea, and low back pain, and although no certain cause was found, your exam and evaluation are reassuring in the emergency department stay.  Return to the ER immediately for any worsening pain, weakness, numbness, vomiting, chest pain or trouble breathing, fever, or any other symptoms concerning for you. Please follow-up with your  primary care doctor closely, this week possible.

## 2017-03-21 NOTE — ED Triage Notes (Signed)
States on 3/29 she was diagnosed with a UTI and put on doxycycline, states on Monday she developed and nausea with vomiting, states continued back pain, states she has been able to keep fluids down, states yesterday she noticed "sparkles" in her eyes, awake and alert In no acute distress

## 2017-03-21 NOTE — ED Provider Notes (Signed)
Patient currently on doxycycline to cover what may be recurrent osteomyelitis of the sacrum was presenting to the emergency department today with nausea as well as low back pain. Evaluate by Dr. Shaune Pollack and plan is to follow-up with the patient's MRI. If negative the patient should be cleared for discharge after reevaluation.  Physical Exam  BP (!) 124/54   Pulse 60   Temp 97.9 F (36.6 C) (Oral)   Resp 16   Ht  (1.549 m)   Wt 198 lb (89.8 kg)   SpO2 100%   BMI 37.41 kg/m  ----------------------------------------- 5:24 PM on 03/21/2017 -----------------------------------------   Physical Exam Patient resting comfortably at this time. No current complaints. ED Course  Procedures  MDM MR SACRUM SI JOINTS WO CONTRAST (Accession [id number]) (Order [id number])  Imaging  Date: 03/21/2017 Department: [facility] EMERGENCY DEPARTMENT Released By/Authorizing: Governor Rooks, MD (auto-released)  Exam Information   Status Exam Begun  Exam Ended   Final [99] 03/21/2017 3:36 PM 03/21/2017 4:03 PM  PACS Images   Show images for MR SACRUM SI JOINTS WO CONTRAST  Study Result   CLINICAL DATA:  Low back pain.  History of septic sacroiliitis.  EXAM: MRI PELVIS WITHOUT CONTRAST  TECHNIQUE: Multiplanar multisequence MR imaging of the pelvis was performed. No intravenous contrast was administered.  COMPARISON:  MRI 04/18/2016  FINDINGS: Musculoskeletal: No MR findings suggestive of septic sacroiliitis. Findings suggest chronic sacroiliitis related to prior process. Minimal residual marrow signal abnormality in the sacrum and iliac bones. Minimal fluid in the right SI joint. No sacral stress fracture or bone lesion.  Both hips are normally located.  The pubic symphysis is intact.  No significant intrapelvic abnormalities are identified.  IMPRESSION: Chronic postinflammatory/ postinfectious changes involving the SI joints but no findings suspicious for  recurrent septic arthritis or osteomyelitis. If symptoms persist or worsen pre and postcontrast imaging may be helpful for further evaluation.   Electronically Signed   By: Rudie Meyer M.D.   On: 03/21/2017 16:25     Patient without any signs recurrent osteomyelitis on MRI. Updated her about these findings. She'll be discharged to home. She'll continue on her doxycycline.       Myrna Blazer, MD 03/21/17 [phone]

## 2017-03-22 LAB — URINE CULTURE

## 2017-03-26 LAB — CULTURE, BLOOD (ROUTINE X 2)
CULTURE: NO GROWTH
CULTURE: NO GROWTH
Special Requests: ADEQUATE

## 2019-03-15 IMAGING — CT CT HEAD W/O CM
3 series · 14 of 47 positions shown, 16 images · non-contrast
Comparison: None.

CLINICAL DATA: Headache

EXAM:
CT HEAD WITHOUT CONTRAST
TECHNIQUE: Contiguous axial images were obtained from the base of the skull
through the vertex without intravenous contrast.

[Series 2: head wo · axial · 0.47mm/px · z∈[-135,-5]mm · 8 of 32 slices shown, 10 images]
[im 3/32  brain]
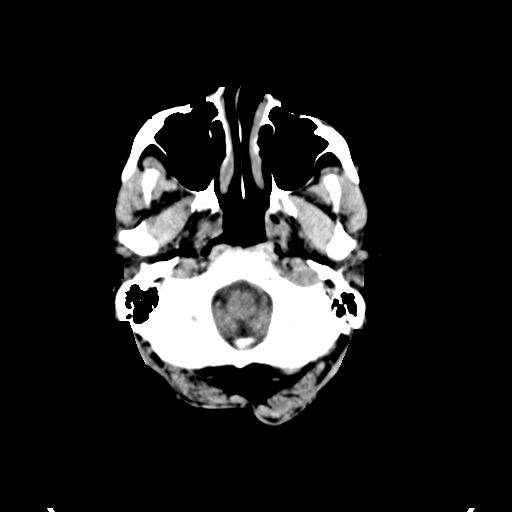
[im 3/32  bone]
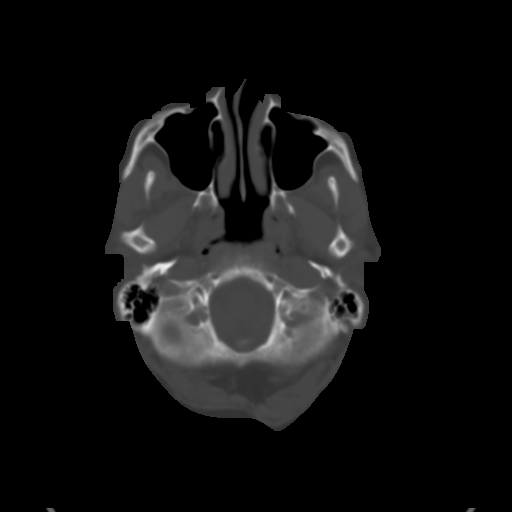
[im 7/32  brain]
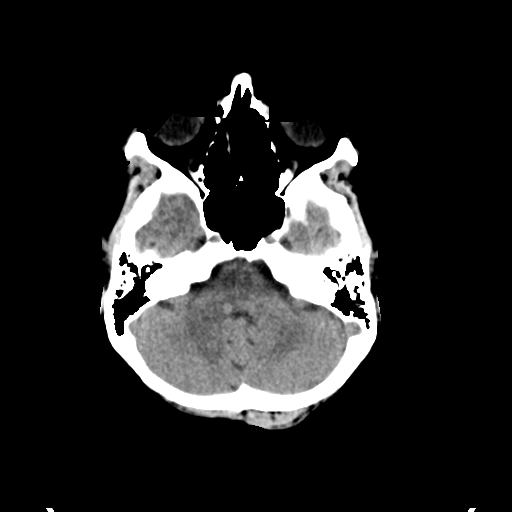
[im 10/32  brain]
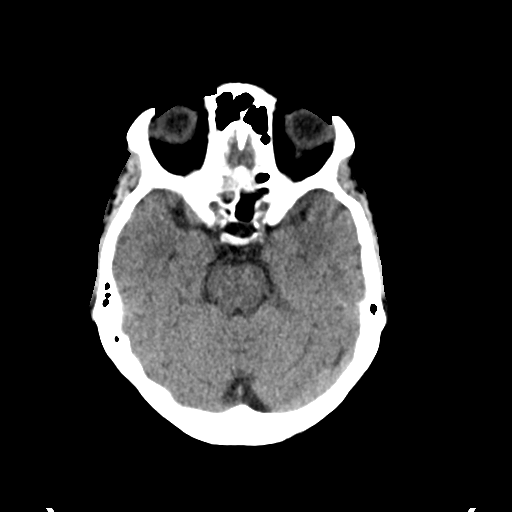
[im 14/32  brain]
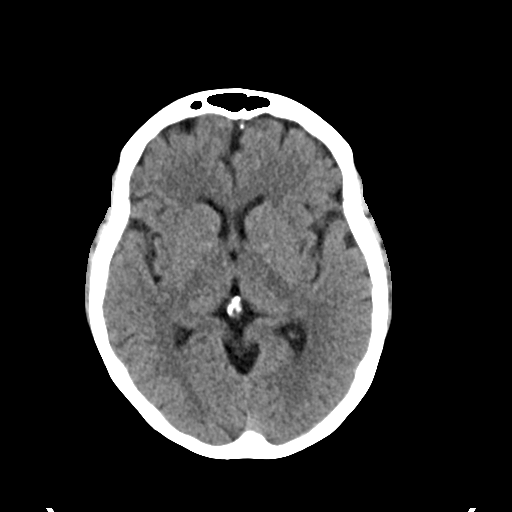
[im 18/32  brain]
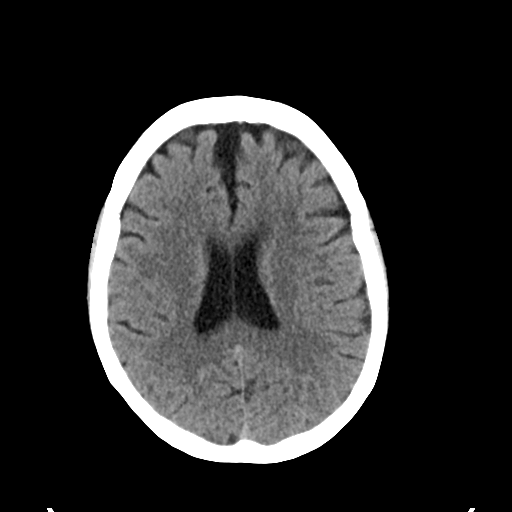
[im 18/32  bone]
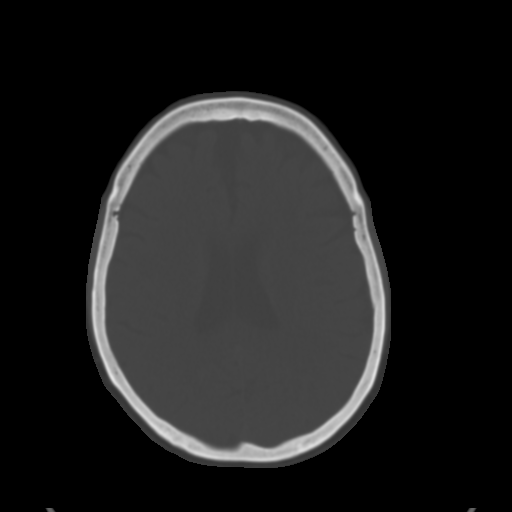
[im 22/32  brain]
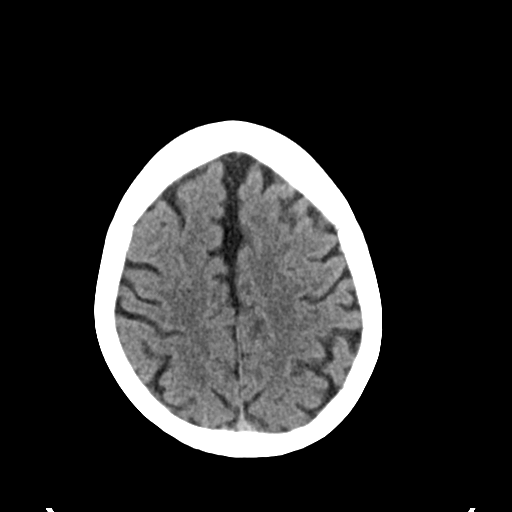
[im 25/32  brain]
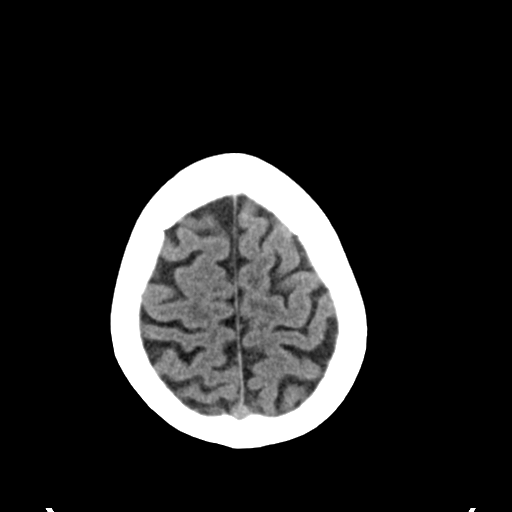
[im 29/32  brain]
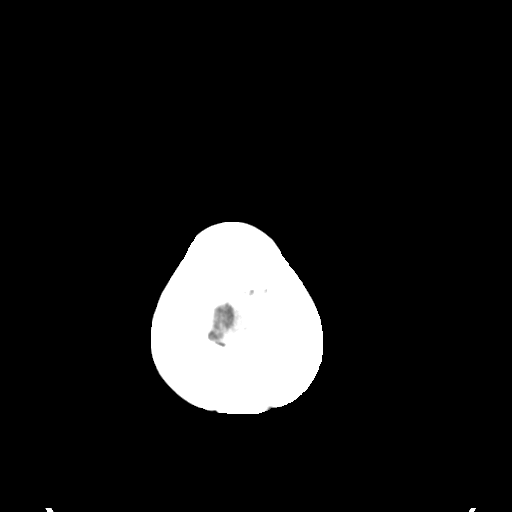

[Series 4: coronal soft tissue · coronal · 0.32mm/px · 3 of 58 slices shown]
[im 20/58  brain]
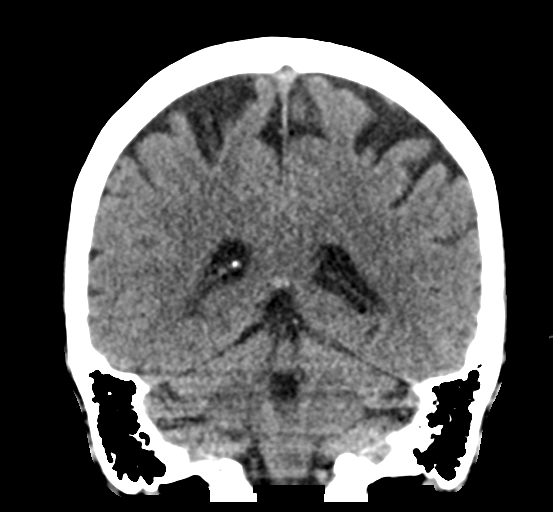
[im 26/58  brain]
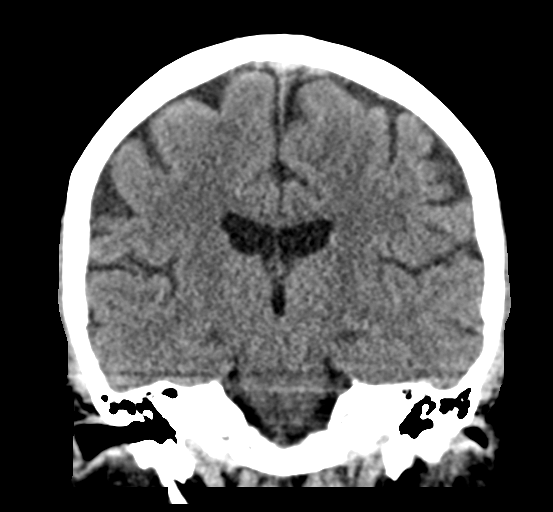
[im 32/58  brain]
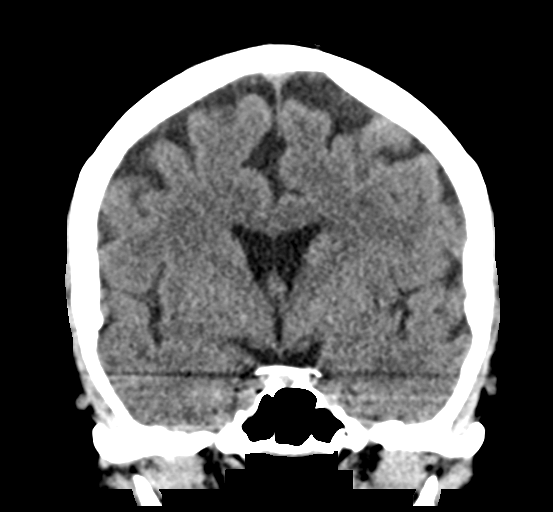

[Series 5: sagittal soft tissue · sagittal · 0.30mm/px · 3 of 47 slices shown]
[im 16/47  brain]
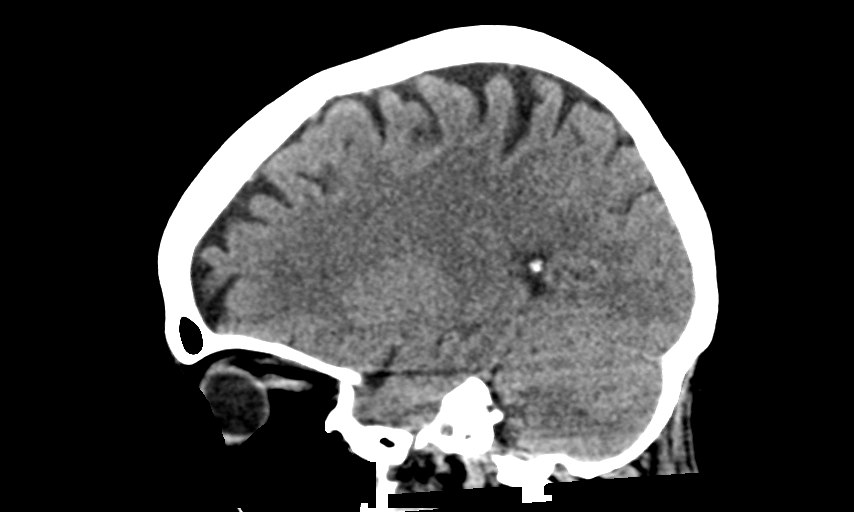
[im 24/47  brain]
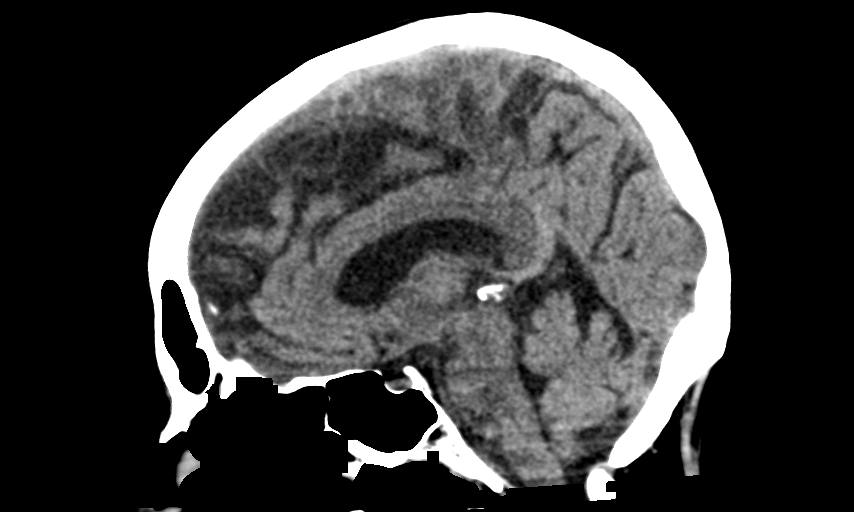
[im 31/47  brain]
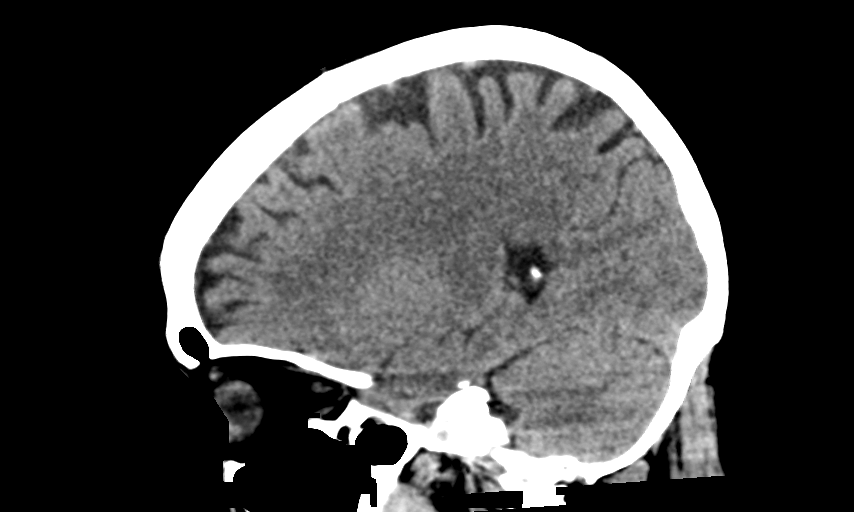

[14 of 47 positions shown; findings below may reference images not displayed]

FINDINGS: Brain: The ventricles are normal in size and configuration. There is
mild frontal atrophy bilaterally. There is no demonstrable
intracranial mass, hemorrhage, extra-axial fluid collection, or
midline shift. There is slight small vessel disease in the centra
semiovale bilaterally. There is also decreased attenuation in the
mid pons in the basilar perforator distribution, felt to represent
small vessel disease. No acute appearing infarct evident.

Vascular: There is no appreciable hyperdense vessel. There are foci
of calcification in each cavernous carotid artery region.

Skull: Bony calvarium appears intact.

Sinuses/Orbits: There is opacification of several ethmoid air cells
on the right anteriorly. Other visualized paranasal sinuses are
clear. There is rightward deviation of the nasal septum. Orbits
appear symmetric bilaterally.

Other: Mastoid air cells are clear.
IMPRESSION: Mild frontal atrophy bilaterally. No intracranial mass, hemorrhage,
or extra-axial fluid collection. Mild periventricular and mid
pontine small vessel disease. No acute appearing infarct. Mild
vascular calcification noted bilaterally. Opacification of anterior
ethmoid air cells on the right consistent with paranasal sinus
disease in this region.
